# Patient Record
Sex: Female | Born: 1955 | Race: Black or African American | Hispanic: No | Marital: Single | Smoking: Current some day smoker
Health system: Southern US, Community
[De-identification: ages and names within clinical notes are randomized; demographics above are authoritative.]

## PROBLEM LIST (undated history)

## (undated) DIAGNOSIS — B353 Tinea pedis: Secondary | ICD-10-CM

## (undated) DIAGNOSIS — D0591 Unspecified type of carcinoma in situ of right breast: Secondary | ICD-10-CM

## (undated) DIAGNOSIS — F1011 Alcohol abuse, in remission: Secondary | ICD-10-CM

## (undated) DIAGNOSIS — F209 Schizophrenia, unspecified: Secondary | ICD-10-CM

## (undated) DIAGNOSIS — E559 Vitamin D deficiency, unspecified: Secondary | ICD-10-CM

## (undated) DIAGNOSIS — I10 Essential (primary) hypertension: Secondary | ICD-10-CM

## (undated) DIAGNOSIS — R413 Other amnesia: Secondary | ICD-10-CM

## (undated) DIAGNOSIS — L853 Xerosis cutis: Secondary | ICD-10-CM

## (undated) DIAGNOSIS — E785 Hyperlipidemia, unspecified: Secondary | ICD-10-CM

## (undated) DIAGNOSIS — C50919 Malignant neoplasm of unspecified site of unspecified female breast: Secondary | ICD-10-CM

## (undated) DIAGNOSIS — R32 Unspecified urinary incontinence: Secondary | ICD-10-CM

## (undated) DIAGNOSIS — F0280 Dementia in other diseases classified elsewhere without behavioral disturbance: Secondary | ICD-10-CM

## (undated) DIAGNOSIS — F0390 Unspecified dementia without behavioral disturbance: Secondary | ICD-10-CM

## (undated) DIAGNOSIS — M25551 Pain in right hip: Secondary | ICD-10-CM

## (undated) DIAGNOSIS — G4700 Insomnia, unspecified: Secondary | ICD-10-CM

## (undated) DIAGNOSIS — R079 Chest pain, unspecified: Secondary | ICD-10-CM

## (undated) DIAGNOSIS — E119 Type 2 diabetes mellitus without complications: Secondary | ICD-10-CM

## (undated) DIAGNOSIS — Z87440 Personal history of urinary (tract) infections: Secondary | ICD-10-CM

## (undated) HISTORY — DX: Insomnia, unspecified: G47.00

## (undated) HISTORY — DX: Xerosis cutis: L85.3

## (undated) HISTORY — DX: Tinea pedis: B35.3

## (undated) HISTORY — DX: Personal history of urinary (tract) infections: Z87.440

## (undated) HISTORY — DX: Hyperlipidemia, unspecified: E78.5

## (undated) HISTORY — DX: Dementia in other diseases classified elsewhere, unspecified severity, without behavioral disturbance, psychotic disturbance, mood disturbance, and anxiety: F02.80

## (undated) HISTORY — DX: Unspecified urinary incontinence: R32

## (undated) HISTORY — DX: Schizophrenia, unspecified: F20.9

## (undated) HISTORY — DX: Unspecified type of carcinoma in situ of right breast: D05.91

## (undated) HISTORY — PX: TUBAL LIGATION: SHX77

## (undated) HISTORY — DX: Chest pain, unspecified: R07.9

## (undated) HISTORY — DX: Pain in right hip: M25.551

## (undated) HISTORY — DX: Vitamin D deficiency, unspecified: E55.9

## (undated) HISTORY — DX: Malignant neoplasm of unspecified site of unspecified female breast: C50.919

## (undated) HISTORY — PX: BREAST LUMPECTOMY: SHX2

---

## 2016-08-21 ENCOUNTER — Emergency Department (HOSPITAL_COMMUNITY)
Admission: EM | Admit: 2016-08-21 | Discharge: 2016-08-22 | Disposition: A | Discharge status: Home / Self Care | Payer: Medicare Other | Attending: Emergency Medicine | Admitting: Emergency Medicine

## 2016-08-21 ENCOUNTER — Encounter (HOSPITAL_COMMUNITY): Payer: Self-pay | Admitting: Emergency Medicine

## 2016-08-21 DIAGNOSIS — R040 Epistaxis: Secondary | ICD-10-CM

## 2016-08-21 DIAGNOSIS — F17200 Nicotine dependence, unspecified, uncomplicated: Secondary | ICD-10-CM | POA: Insufficient documentation

## 2016-08-21 DIAGNOSIS — I10 Essential (primary) hypertension: Secondary | ICD-10-CM | POA: Diagnosis not present

## 2016-08-21 HISTORY — DX: Unspecified dementia, unspecified severity, without behavioral disturbance, psychotic disturbance, mood disturbance, and anxiety: F03.90

## 2016-08-21 HISTORY — DX: Essential (primary) hypertension: I10

## 2016-08-21 HISTORY — DX: Other amnesia: R41.3

## 2016-08-21 MED ORDER — OXYMETAZOLINE HCL 0.05 % NA SOLN
1.0000 | Freq: Once | NASAL | Status: AC
  Administered 2016-08-21: 1 via NASAL
  Filled 2016-08-21: qty 15

## 2016-08-21 NOTE — ED Notes (Signed)
Bed: WA17 Expected date:  Expected time:  Means of arrival:  Comments: HTN

## 2016-08-21 NOTE — ED Provider Notes (Signed)
[facility] DEPT Provider Note   CSN: [id number] Arrival date & time: 08/21/16  2208  By signing my name below, I, Higinio Plan, attest that this documentation has been prepared under the direction and in the presence of Saliah Crisp, MD . Electronically Signed: Higinio Plan, Scribe. 08/21/2016. 11:51 PM.  History   Chief Complaint Chief Complaint  Patient presents with  . Epistaxis   The history is provided by the patient. No language interpreter was used.  Epistaxis   This is a recurrent problem. The current episode started 3 to 5 hours ago. The problem occurs constantly. The problem has been resolved. The problem is associated with an unknown factor. She has tried vasoconstrictors for the symptoms. The treatment provided significant relief. Her past medical history does not include sinus problems.   HPI Comments: Lindsey Garcia is a 60 y.o. female who presents to the Emergency Department complaining of gradually improving, epistaxis that began at ~7:30 PM this evening. Pt reports she awoke from a nap this evening with an epistaxis. She states she has had similar episodes before but has never sustained one for this long. Pt is not actively bleeding at this time. She reports her house was not abnoramlly dry today. She denies use of blood thinners.   Past Medical History:  Diagnosis Date  . Amnestic disorder   . Dementia   . Hypertension    There are no active problems to display for this patient.  History reviewed. No pertinent surgical history.  OB History    No data available     Home Medications    Prior to Admission medications   Not on File    Family History No family history on file.  Social History Social History  Substance Use Topics  . Smoking status: Never Smoker  . Smokeless tobacco: Not on file  . Alcohol use Not on file     Allergies   Review of patient's allergies indicates not on file.   Review of Systems Review of Systems  Constitutional:  Negative for fever.  HENT: Positive for nosebleeds. Negative for congestion.   Respiratory: Negative for shortness of breath.   Cardiovascular: Negative for chest pain.  All other systems reviewed and are negative.  Physical Exam Updated Vital Signs BP 121/84   Pulse 80   Temp 98.1 F (36.7 C) (Oral)   Resp 18   Ht 5\' 5"  (1.651 m)   LMP  (LMP Unknown)   SpO2 96%   Physical Exam  Constitutional: She is oriented to person, place, and time. She appears well-developed and well-nourished. No distress.  HENT:  Head: Normocephalic and atraumatic.  Nose: Nose normal.  Mouth/Throat: Oropharynx is clear and moist. No oropharyngeal exudate.  No anterior bleed, no bleeding down the back of the throat Moist mucous membranes   Eyes: Conjunctivae and EOM are normal. Pupils are equal, round, and reactive to light.  Neck: Normal range of motion. Neck supple. No JVD present.  Trachea midline No bruit  Cardiovascular: Normal rate, regular rhythm, normal heart sounds and intact distal pulses.   Pulmonary/Chest: Effort normal and breath sounds normal. No stridor. No respiratory distress.  Abdominal: Soft. Bowel sounds are normal. She exhibits no distension. There is no rebound and no guarding.  Musculoskeletal: Normal range of motion.  Neurological: She is alert and oriented to person, place, and time. She has normal reflexes.  Skin: Skin is warm and dry. Capillary refill takes less than 2 seconds.  Psychiatric: She has a normal mood  and affect. Her behavior is normal.  Nursing note and vitals reviewed.  ED Treatments / Results  Labs (all labs ordered are listed, but only abnormal results are displayed) Labs Reviewed - No data to display  EKG  EKG Interpretation None       Radiology No results found.  Procedures Procedures  DIAGNOSTIC STUDIES:  Oxygen Saturation is 96% on RA, normal by my interpretation.    COORDINATION OF CARE:  11:51 PM Discussed treatment plan with pt at  bedside and pt agreed to plan.  Medications Ordered in ED Medications  oxymetazoline (AFRIN) 0.05 % nasal spray 1 spray (1 spray Each Nare Given 08/21/16 2232)   Initial Impression / Assessment and Plan / ED Course  I have reviewed the triage vital signs and the nursing notes.  Pertinent labs & imaging results that were available during my care of the patient were reviewed by me and considered in my medical decision making (see chart for details).  Clinical Course    Vitals:   08/21/16 2208 08/21/16 2211  BP: 135/65 121/84  Pulse: 78 80  Resp: 18   Temp: 98.1 F (36.7 C)     Medications  oxymetazoline (AFRIN) 0.05 % nasal spray 1 spray (1 spray Each Nare Given 08/21/16 2232)    I personally performed the services described in this documentation, which was scribed in my presence. The recorded information has been reviewed and is accurate.   Final Clinical Impressions(s) / ED Diagnoses   Final diagnoses:  None   No further bleeding in the ED.  All questions answered to patient's satisfaction. Based on history and exam patient has been appropriately medically screened and emergency conditions excluded. Patient is stable for discharge at this time. Follow up with your PMD for recheck in 2 days and strict return precautions given New Prescriptions New Prescriptions   No medications on file     Amarya Kuehl, MD 08/22/16 [phone]

## 2016-08-21 NOTE — ED Triage Notes (Signed)
Per EMS: Pt from [facility] home. Pt c/o epistaxis since 1830. Pt has tissue in nose to stop bleeding, but can't get it to stop. Pt has nosebleeds frequently. Pt not on any anticoagulants. Pt has dementia. Pt is oriented to person, place, but not age. Pt can walk with assistance. Pt c/o headache started while on the truck  153/117, 83, 16, cbg 132

## 2016-08-22 ENCOUNTER — Encounter (HOSPITAL_COMMUNITY): Payer: Self-pay | Admitting: Emergency Medicine

## 2016-08-22 NOTE — ED Notes (Signed)
[place] given to pt.

## 2016-08-23 ENCOUNTER — Encounter (HOSPITAL_COMMUNITY): Payer: Self-pay | Admitting: Emergency Medicine

## 2016-08-23 ENCOUNTER — Emergency Department (HOSPITAL_COMMUNITY)
Admission: EM | Admit: 2016-08-23 | Discharge: 2016-08-23 | Disposition: A | Discharge status: Home / Self Care | Payer: Medicare Other | Attending: Emergency Medicine | Admitting: Emergency Medicine

## 2016-08-23 DIAGNOSIS — R040 Epistaxis: Secondary | ICD-10-CM | POA: Diagnosis not present

## 2016-08-23 DIAGNOSIS — F17200 Nicotine dependence, unspecified, uncomplicated: Secondary | ICD-10-CM | POA: Insufficient documentation

## 2016-08-23 DIAGNOSIS — R51 Headache: Secondary | ICD-10-CM | POA: Diagnosis not present

## 2016-08-23 DIAGNOSIS — I10 Essential (primary) hypertension: Secondary | ICD-10-CM | POA: Insufficient documentation

## 2016-08-23 MED ORDER — SILVER NITRATE-POT NITRATE 75-25 % EX MISC
1.0000 "application " | Freq: Once | CUTANEOUS | Status: AC
  Administered 2016-08-23: 1 via TOPICAL
  Filled 2016-08-23: qty 1

## 2016-08-23 MED ORDER — OXYMETAZOLINE HCL 0.05 % NA SOLN
1.0000 | Freq: Once | NASAL | Status: AC
  Administered 2016-08-23: 1 via NASAL
  Filled 2016-08-23: qty 15

## 2016-08-23 NOTE — ED Triage Notes (Signed)
Report called to [place] at [facility]

## 2016-08-23 NOTE — ED Provider Notes (Signed)
[facility] DEPT Provider Note   CSN: [id number] Arrival date & time: 08/23/16  1524  By signing my name below, I, Shanna Cisco, attest that this documentation has been prepared under the direction and in the presence of [employer], PA-C. Electronically signed by: Shanna Cisco, ED Scribe. 08/23/16. 5:05 PM.   History   Chief Complaint Chief Complaint  Patient presents with  . Epistaxis    resolved   The history is provided by the patient. No language interpreter was used.   HPI Comments:  Lindsey Garcia is a 60 y.o. female with PMHx of hypertension and amnestic disorder brought in by ambulance, who presents to the Emergency Department complaining of multiple episodes of epistaxis, which started yesterday. She was seen in the ED last night for same symptoms and was given Afrin nasal spray. Pt was advised to follow up with PCP tomorrow, but returned because she experienced another episode at 0900 this morning. Nosebleed lasted approximately an hour and stopped prior to EMS arrival. Associated symptoms include mild nausea and headache. Used approximately a roll of toilet paper to control bleeding. No denials reported.  Past Medical History:  Diagnosis Date  . Amnestic disorder   . Dementia   . Hypertension     There are no active problems to display for this patient.   No past surgical history on file.  OB History    No data available       Home Medications    Prior to Admission medications   Not on File    Family History No family history on file.  Social History Social History  Substance Use Topics  . Smoking status: Never Smoker  . Smokeless tobacco: Not on file  . Alcohol use Not on file     Allergies   Review of patient's allergies indicates not on file.   Review of Systems Review of Systems  HENT: Rhinorrhea:  epistaxis.   Gastrointestinal: Positive for nausea.  Neurological: Positive for headaches.    Physical Exam Updated Vital Signs LMP   (LMP Unknown)   Physical Exam  Constitutional: She is oriented to person, place, and time. She appears well-developed and well-nourished.  HENT:  Head: Normocephalic and atraumatic.  Mouth/Throat: Oropharynx is clear and moist.  Area of bleeding in anterior nasal septum.  Eyes: Pupils are equal, round, and reactive to light.  Neck: Normal range of motion.  Cardiovascular: Normal rate, regular rhythm and normal heart sounds.   Pulmonary/Chest: Effort normal and breath sounds normal.  Musculoskeletal: Normal range of motion.  Neurological: She is alert and oriented to person, place, and time.  Skin: Skin is warm and dry.  Psychiatric: She has a normal mood and affect.  Nursing note and vitals reviewed.    ED Treatments / Results  DIAGNOSTIC STUDIES:  Oxygen Saturation is 96% on room air, normal by my interpretation.    COORDINATION OF CARE:  4:40 PM Discussed treatment plan with pt at bedside, which includes Afrin nasal spray and cauterization with silver nitrate, and pt agreed to plan.  Labs (all labs ordered are listed, but only abnormal results are displayed) Labs Reviewed - No data to display  EKG  EKG Interpretation None       Radiology No results found.  Procedures .Epistaxis Management Date/Time: 08/27/2016 3:57 PM Performed by: Dalia Heading Authorized by: Dalia Heading   Consent:    Consent obtained:  Verbal   Consent given by:  Patient   Risks discussed:  Bleeding   Alternatives  discussed:  No treatment and delayed treatment Anesthesia (see MAR for exact dosages):    Anesthesia method:  None Procedure details:    Treatment site:  R anterior   Treatment method:  Silver nitrate   Treatment episode: initial   Post-procedure details:    Assessment:  No improvement   Patient tolerance of procedure:  Tolerated well, no immediate complications Comments:     She was not actively bleeding on my examination, but I did cauterize an area that  appeared to be the source of bleeding   (including critical care time)  Medications Ordered in ED Medications - No data to display   Initial Impression / Assessment and Plan / ED Course  I have reviewed the triage vital signs and the nursing notes.  Pertinent labs & imaging results that were available during my care of the patient were reviewed by me and considered in my medical decision making (see chart for details).  Clinical Course    Room air  Final Clinical Impressions(s) / ED Diagnoses   Final diagnoses:  None    New Prescriptions New Prescriptions   No medications on file  I personally performed the services described in this documentation, which was scribed in my presence. The recorded information has been reviewed and is accurate.     Dalia Heading, PA-C 08/27/16 1558    Isla Pence, MD 08/27/16 2222

## 2016-08-23 NOTE — ED Triage Notes (Signed)
[facility]/EMS called to  Transport pt to [facility]

## 2016-08-23 NOTE — Discharge Instructions (Signed)
Follow up with primary care doctor.

## 2016-08-23 NOTE — ED Triage Notes (Signed)
Per EMS- facility called to transport pt. To ED for c/o epistaxis x 2 hours. No bleeding at present. Pt stated that nosebleed stopped prior to EMS arrival. Pt is alert, oriented, cooperative and ambulatory

## 2016-08-24 ENCOUNTER — Emergency Department (HOSPITAL_COMMUNITY)
Admission: EM | Admit: 2016-08-24 | Discharge: 2016-08-24 | Disposition: A | Discharge status: Home / Self Care | Payer: Medicare Other | Attending: Emergency Medicine | Admitting: Emergency Medicine

## 2016-08-24 ENCOUNTER — Encounter (HOSPITAL_COMMUNITY): Payer: Self-pay | Admitting: Emergency Medicine

## 2016-08-24 DIAGNOSIS — Z79899 Other long term (current) drug therapy: Secondary | ICD-10-CM | POA: Diagnosis not present

## 2016-08-24 DIAGNOSIS — F17200 Nicotine dependence, unspecified, uncomplicated: Secondary | ICD-10-CM | POA: Diagnosis not present

## 2016-08-24 DIAGNOSIS — I10 Essential (primary) hypertension: Secondary | ICD-10-CM | POA: Insufficient documentation

## 2016-08-24 DIAGNOSIS — Z7984 Long term (current) use of oral hypoglycemic drugs: Secondary | ICD-10-CM | POA: Diagnosis not present

## 2016-08-24 DIAGNOSIS — Z7982 Long term (current) use of aspirin: Secondary | ICD-10-CM | POA: Diagnosis not present

## 2016-08-24 DIAGNOSIS — R040 Epistaxis: Secondary | ICD-10-CM | POA: Diagnosis not present

## 2016-08-24 LAB — CBC WITH DIFFERENTIAL/PLATELET
Basophils Absolute: 0 10*3/uL (ref 0.0–0.1)
Basophils Relative: 0 %
EOS PCT: 2 %
Eosinophils Absolute: 0.2 10*3/uL (ref 0.0–0.7)
HCT: 31.6 % — ABNORMAL LOW (ref 36.0–46.0)
HEMOGLOBIN: 10.2 g/dL — AB (ref 12.0–15.0)
LYMPHS ABS: 2.5 10*3/uL (ref 0.7–4.0)
Lymphocytes Relative: 29 %
MCH: 28.8 pg (ref 26.0–34.0)
MCHC: 32.3 g/dL (ref 30.0–36.0)
MCV: 89.3 fL (ref 78.0–100.0)
MONOS PCT: 6 %
Monocytes Absolute: 0.5 10*3/uL (ref 0.1–1.0)
NEUTROS PCT: 63 %
Neutro Abs: 5.5 10*3/uL (ref 1.7–7.7)
Platelets: 327 10*3/uL (ref 150–400)
RBC: 3.54 MIL/uL — ABNORMAL LOW (ref 3.87–5.11)
RDW: 13.2 % (ref 11.5–15.5)
WBC: 8.7 10*3/uL (ref 4.0–10.5)

## 2016-08-24 LAB — BASIC METABOLIC PANEL
Anion gap: 7 (ref 5–15)
BUN: 13 mg/dL (ref 6–20)
CHLORIDE: 107 mmol/L (ref 101–111)
CO2: 24 mmol/L (ref 22–32)
Calcium: 9.3 mg/dL (ref 8.9–10.3)
Creatinine, Ser: 0.74 mg/dL (ref 0.44–1.00)
GFR calc Af Amer: >60 mL/min (ref >60)
GFR calc non Af Amer: >60 mL/min (ref >60)
Glucose, Bld: 140 mg/dL — ABNORMAL HIGH (ref 65–99)
Potassium: 3.5 mmol/L (ref 3.5–5.1)
Sodium: 138 mmol/L (ref 135–145)

## 2016-08-24 LAB — PROTIME-INR
INR: 1.07
PROTHROMBIN TIME: 13.9 s (ref 11.4–15.2)

## 2016-08-24 MED ORDER — SALINE SPRAY 0.65 % NA SOLN: 1.0000 | Freq: Two times a day (BID) | NASAL | 0 refills | Status: DC

## 2016-08-24 MED ORDER — OXYMETAZOLINE HCL 0.05 % NA SOLN: 1.0000 | Freq: Two times a day (BID) | NASAL | 0 refills | Status: DC

## 2016-08-24 NOTE — ED Triage Notes (Signed)
Per EMS pt.from [facility] complaint of nose bleeding which noted  at 0430 this morning while  watching TV. Pt. Denied pain nor SOB. No active bleeding noted upon arrival to ED. Alert and oriented x2.

## 2016-08-24 NOTE — ED Notes (Signed)
Bed: [id number] Expected date:  Expected time:  Means of arrival:  Comments: EMS 60yo F from [facility] / Nosebleed

## 2016-08-24 NOTE — ED Provider Notes (Signed)
[facility] DEPT Provider Note   CSN: [id number] Arrival date & time: 08/24/16  [id number]     History   Chief Complaint Chief Complaint  Patient presents with  . Epistaxis    HPI Lindsey Garcia is a 60 y.o. female.  The history is provided by the patient and the nursing home. No language interpreter was used.   Lindsey Garcia is a 60 y.o. female who presents to the Emergency Department complaining of epistaxis.  Level V caveat due to dementia. Hx is provided by [facility] nurse and hospital records.  Per nursing home pt had a nose bleed, additional details are unavailable.  Pt reports bleeding from her right nare, no additional complaints.  Per ED records pt has had two ED visits in last three days for epistaxis.  Pt denies fever, headache, sob, N/V/D, chest pain.    Past Medical History:  Diagnosis Date  . Amnestic disorder   . Dementia   . Hypertension     There are no active problems to display for this patient.   History reviewed. No pertinent surgical history.  OB History    No data available       Home Medications    Prior to Admission medications   Medication Sig Start Date End Date Taking? Authorizing Provider  benztropine (COGENTIN) 1 MG tablet Take 1 mg by mouth 2 (two) times daily.   Yes Historical Provider, MD  busPIRone (BUSPAR) 15 MG tablet Take 15 mg by mouth 2 (two) times daily.   Yes Historical Provider, MD  cholecalciferol (VITAMIN D) 1000 units tablet Take 1,000 Units by mouth daily.   Yes Historical Provider, MD  donepezil (ARICEPT) 10 MG tablet Take 10 mg by mouth at bedtime.   Yes Historical Provider, MD  lithium carbonate 300 MG capsule Take 600 mg by mouth at bedtime.   Yes Historical Provider, MD  Melatonin 3 MG TABS Take 1 tablet by mouth daily.   Yes Historical Provider, MD  metFORMIN (GLUCOPHAGE) 500 MG tablet Take 500 mg by mouth 2 (two) times daily with a meal.   Yes Historical Provider, MD  metoprolol succinate (TOPROL-XL) 25 MG 24 hr tablet  Take 25 mg by mouth 2 (two) times daily.   Yes Historical Provider, MD  Multiple Vitamin (MULTIVITAMIN WITH MINERALS) TABS tablet Take 1 tablet by mouth daily.   Yes Historical Provider, MD  pravastatin (PRAVACHOL) 20 MG tablet Take 20 mg by mouth daily.   Yes Historical Provider, MD  risperiDONE (RISPERDAL) 3 MG tablet Take 3 mg by mouth at bedtime.   Yes Historical Provider, MD  aspirin 81 MG chewable tablet Chew 81 mg by mouth daily.    Historical Provider, MD  oxymetazoline (AFRIN NASAL SPRAY) 0.05 % nasal spray Place 1 spray into both nostrils 2 (two) times daily. Use for three days 08/24/16   Quintella Reichert, MD  sodium chloride (OCEAN) 0.65 % SOLN nasal spray Place 1 spray into both nostrils 2 (two) times daily. 08/24/16   Quintella Reichert, MD    Family History No family history on file.  Social History Social History  Substance Use Topics  . Smoking status: Current Some Day Smoker  . Smokeless tobacco: Never Used  . Alcohol use No     Allergies   Review of patient's allergies indicates no known allergies.   Review of Systems Review of Systems  All other systems reviewed and are negative.    Physical Exam Updated Vital Signs BP 126/76 (BP Location: Left Arm)  Pulse 72   Temp 98.7 F (37.1 C) (Oral)   Resp 18   LMP  (LMP Unknown)   SpO2 98%   Physical Exam  Constitutional: She appears well-developed and well-nourished. No distress.  HENT:  Head: Normocephalic and atraumatic.  Mouth/Throat: Oropharynx is clear and moist.  Dried blood in right anterior nare  Cardiovascular: Normal rate and regular rhythm.   No murmur heard. Pulmonary/Chest: Effort normal and breath sounds normal. No respiratory distress.  Abdominal: Soft. She exhibits no mass. There is no tenderness. There is no guarding.  Musculoskeletal: She exhibits no edema or tenderness.  Neurological: She is alert.  Disoriented to place and time.  Pleasantly confused  Skin: Skin is warm and dry.    Psychiatric: She has a normal mood and affect. Her behavior is normal.  Nursing note and vitals reviewed.    ED Treatments / Results  Labs (all labs ordered are listed, but only abnormal results are displayed) Labs Reviewed  BASIC METABOLIC PANEL - Abnormal; Notable for the following:       Result Value   Glucose, Bld 140 (*)    All other components within normal limits  CBC WITH DIFFERENTIAL/PLATELET - Abnormal; Notable for the following:    RBC 3.54 (*)    Hemoglobin 10.2 (*)    HCT 31.6 (*)    All other components within normal limits  PROTIME-INR    EKG  EKG Interpretation None       Radiology No results found.  Procedures Procedures (including critical care time)  Medications Ordered in ED Medications - No data to display   Initial Impression / Assessment and Plan / ED Course  I have reviewed the triage vital signs and the nursing notes.  Pertinent labs & imaging results that were available during my care of the patient were reviewed by me and considered in my medical decision making (see chart for details).  Clinical Course    Patient with history of dementia in the emergency department for epistaxis that has resolved. She does have dried blood in her anterior nare. Labs obtained given she is a poor historian and this is her third ED visit for epistaxis. CBC demonstrates mild anemia, BMP with normal renal function.  Plan to DC home with nasal saline, Afrin, outpatient PCP and ENT follow-up as well as return precautions.  Final Clinical Impressions(s) / ED Diagnoses   Final diagnoses:  Acute anterior epistaxis    New Prescriptions Discharge Medication List as of 08/24/2016  8:35 AM    START taking these medications   Details  oxymetazoline (AFRIN NASAL SPRAY) 0.05 % nasal spray Place 1 spray into both nostrils 2 (two) times daily. Use for three days, Starting Fri 08/24/2016, Print    sodium chloride (OCEAN) 0.65 % SOLN nasal spray Place 1 spray into  both nostrils 2 (two) times daily., Starting Fri 08/24/2016, Print         Quintella Reichert, MD 08/24/16 [phone]

## 2016-08-31 ENCOUNTER — Emergency Department (HOSPITAL_COMMUNITY)
Admission: EM | Admit: 2016-08-31 | Discharge: 2016-08-31 | Disposition: A | Discharge status: Home / Self Care | Payer: Medicare Other | Attending: Emergency Medicine | Admitting: Emergency Medicine

## 2016-08-31 ENCOUNTER — Encounter (HOSPITAL_COMMUNITY): Payer: Self-pay | Admitting: Emergency Medicine

## 2016-08-31 DIAGNOSIS — Z79899 Other long term (current) drug therapy: Secondary | ICD-10-CM | POA: Diagnosis not present

## 2016-08-31 DIAGNOSIS — F17200 Nicotine dependence, unspecified, uncomplicated: Secondary | ICD-10-CM | POA: Insufficient documentation

## 2016-08-31 DIAGNOSIS — Z7982 Long term (current) use of aspirin: Secondary | ICD-10-CM | POA: Diagnosis not present

## 2016-08-31 DIAGNOSIS — I10 Essential (primary) hypertension: Secondary | ICD-10-CM | POA: Diagnosis not present

## 2016-08-31 DIAGNOSIS — Z7984 Long term (current) use of oral hypoglycemic drugs: Secondary | ICD-10-CM | POA: Diagnosis not present

## 2016-08-31 DIAGNOSIS — R040 Epistaxis: Secondary | ICD-10-CM

## 2016-08-31 MED ORDER — SILVER NITRATE-POT NITRATE 75-25 % EX MISC
1.0000 | Freq: Once | CUTANEOUS | Status: AC
  Administered 2016-08-31: 1 via TOPICAL
  Filled 2016-08-31: qty 1

## 2016-08-31 NOTE — ED Notes (Signed)
[facility] notified of transportation need 

## 2016-08-31 NOTE — ED Provider Notes (Signed)
[facility] DEPT Provider Note   CSN: [id number] Arrival date & time: 08/31/16  1115     History   Chief Complaint Chief Complaint  Patient presents with  . Epistaxis    HPI Lindsey Garcia is a 60 y.o. female.  The history is provided by the patient.  Epistaxis   This is a recurrent problem. The current episode started 6 to 12 hours ago. The problem occurs every several days. The problem has been resolved. The problem is associated with nose-picking. She has tried applying pressure for the symptoms. Her past medical history is significant for frequent nosebleeds (4th ED visit in last 1.5 weeks). Her past medical history does not include bleeding disorder.   Lindsey Garcia is a 60 y.o. female with PMH significant for amnestic disorder, dementia, and HTN who presents with resolved epistaxis.  This is the patient's 4th visit in the last 1.5 weeks for the same.  She states she used the Afrin with relief.  She states this morning around 4 AM she got up and picked her nose and the bleeding started.  It will stop, but then when she scratches her nose it starts again.  It resolves with pressure.  No active bleeding presently.  Denies syncope, lightheadedness, or dizziness.    Past Medical History:  Diagnosis Date  . Amnestic disorder   . Dementia   . Hypertension     There are no active problems to display for this patient.   History reviewed. No pertinent surgical history.  OB History    No data available       Home Medications    Prior to Admission medications   Medication Sig Start Date End Date Taking? Authorizing Provider  aspirin 81 MG chewable tablet Chew 81 mg by mouth daily.   Yes Historical Provider, MD  benztropine (COGENTIN) 1 MG tablet Take 1 mg by mouth 2 (two) times daily.    Historical Provider, MD  busPIRone (BUSPAR) 15 MG tablet Take 15 mg by mouth 2 (two) times daily.    Historical Provider, MD  cholecalciferol (VITAMIN D) 1000 units tablet Take 1,000  Units by mouth daily.    Historical Provider, MD  donepezil (ARICEPT) 10 MG tablet Take 10 mg by mouth at bedtime.    Historical Provider, MD  lithium carbonate 300 MG capsule Take 600 mg by mouth at bedtime.    Historical Provider, MD  Melatonin 3 MG TABS Take 1 tablet by mouth daily.    Historical Provider, MD  metFORMIN (GLUCOPHAGE) 500 MG tablet Take 500 mg by mouth 2 (two) times daily with a meal.    Historical Provider, MD  metoprolol succinate (TOPROL-XL) 25 MG 24 hr tablet Take 25 mg by mouth 2 (two) times daily.    Historical Provider, MD  Multiple Vitamin (MULTIVITAMIN WITH MINERALS) TABS tablet Take 1 tablet by mouth daily.    Historical Provider, MD  oxymetazoline (AFRIN NASAL SPRAY) 0.05 % nasal spray Place 1 spray into both nostrils 2 (two) times daily. Use for three days 08/24/16   Quintella Reichert, MD  pravastatin (PRAVACHOL) 20 MG tablet Take 20 mg by mouth daily.    Historical Provider, MD  risperiDONE (RISPERDAL) 3 MG tablet Take 3 mg by mouth at bedtime.    Historical Provider, MD  sodium chloride (OCEAN) 0.65 % SOLN nasal spray Place 1 spray into both nostrils 2 (two) times daily. 08/24/16   Quintella Reichert, MD    Family History No family history on file.  Social  History Social History  Substance Use Topics  . Smoking status: Current Some Day Smoker  . Smokeless tobacco: Never Used  . Alcohol use No     Allergies   Review of patient's allergies indicates no known allergies.   Review of Systems Review of Systems  HENT: Positive for nosebleeds.   Neurological: Negative for dizziness, syncope and light-headedness.  All other systems reviewed and are negative.    Physical Exam Updated Vital Signs BP 149/83 (BP Location: Left Wrist)   Pulse 95   Temp 98.7 F (37.1 C) (Oral)   Resp 20   Ht 5\' 5"  (1.651 m)   Wt 104.3 kg   LMP  (LMP Unknown)   SpO2 96%   BMI 38.27 kg/m   Physical Exam  Constitutional: She is oriented to person, place, and time. She appears  well-developed and well-nourished.  HENT:  Head: Normocephalic and atraumatic.  Right Ear: External ear normal.  Left Ear: External ear normal.  Nose: No nasal deformity, septal deviation or nasal septal hematoma. Epistaxis is observed.  Mouth/Throat: Oropharynx is clear and moist.  No blood in posterior oropharynx. Source visualized in the right anterior lateral nare.  Dried blood in right anterior nare.   Eyes: Conjunctivae are normal. No scleral icterus.  Neck: No tracheal deviation present.  Cardiovascular: Normal rate and regular rhythm.   Pulmonary/Chest: Effort normal and breath sounds normal. No respiratory distress.  Abdominal: She exhibits no distension.  Musculoskeletal: Normal range of motion.  Neurological: She is alert and oriented to person, place, and time.  Skin: Skin is warm and dry.  Psychiatric: She has a normal mood and affect. Her behavior is normal.     ED Treatments / Results  Labs (all labs ordered are listed, but only abnormal results are displayed) Labs Reviewed - No data to display  EKG  EKG Interpretation None       Radiology No results found.  Procedures .Epistaxis Management Date/Time: 08/31/2016 12:00 PM Performed by: Gloriann Loan Authorized by: Gloriann Loan   Consent:    Consent obtained:  Verbal   Consent given by:  Patient   Risks discussed:  Nasal injury   Alternatives discussed:  No treatment Procedure details:    Treatment site:  R anterior   Treatment method:  Silver nitrate   Treatment complexity:  Limited   Treatment episode: initial   Post-procedure details:    Patient tolerance of procedure:  Tolerated well, no immediate complications Comments:     Applied to source to prevent further bleeding.     (including critical care time)    Medications Ordered in ED Medications  silver nitrate applicators applicator 1 Stick (1 Stick Topical Given by Other 08/31/16 1220)     Initial Impression / Assessment and Plan / ED  Course  I have reviewed the triage vital signs and the nursing notes.  Pertinent labs & imaging results that were available during my care of the patient were reviewed by me and considered in my medical decision making (see chart for details).  Clinical Course   Patient presents with epistaxis that has resolved.  Likely due to trauma, nose picking.  No epistaxis in ED.  VSS, NAD.  Source visualized and this was cauterized with silver nitrate to prevent further episodes.  Discussed abstaining from nose picking or scratching to prevent further episodes.  Follow up PCP.  Return precautions discussed.  Stable for discharge.   Case has been discussed with Dr. Tomi Bamberger who agrees with the  above plan for discharge.   Final Clinical Impressions(s) / ED Diagnoses   Final diagnoses:  Right-sided epistaxis  Anterior epistaxis    New Prescriptions New Prescriptions   No medications on file     Gloriann Loan, PA-C 08/31/16 1253    Dorie Rank, MD 09/02/16 [phone]

## 2016-08-31 NOTE — ED Triage Notes (Signed)
Per EMS, patient's nose has been bleeding for an hour. Denies blood thinner use. Upon EMS, arrival patient's nose was not bleeding. Patient is from [facility].

## 2016-08-31 NOTE — ED Notes (Signed)
Bed: WA02 Expected date:  Expected time:  Means of arrival:  Comments: 

## 2016-09-01 ENCOUNTER — Emergency Department (HOSPITAL_COMMUNITY)
Admission: EM | Admit: 2016-09-01 | Discharge: 2016-09-02 | Disposition: A | Discharge status: Home / Self Care | Payer: Medicare Other | Attending: Emergency Medicine | Admitting: Emergency Medicine

## 2016-09-01 ENCOUNTER — Encounter (HOSPITAL_COMMUNITY): Payer: Self-pay | Admitting: Emergency Medicine

## 2016-09-01 DIAGNOSIS — Z79899 Other long term (current) drug therapy: Secondary | ICD-10-CM | POA: Insufficient documentation

## 2016-09-01 DIAGNOSIS — F17200 Nicotine dependence, unspecified, uncomplicated: Secondary | ICD-10-CM | POA: Diagnosis not present

## 2016-09-01 DIAGNOSIS — Z7982 Long term (current) use of aspirin: Secondary | ICD-10-CM | POA: Diagnosis not present

## 2016-09-01 DIAGNOSIS — I10 Essential (primary) hypertension: Secondary | ICD-10-CM | POA: Insufficient documentation

## 2016-09-01 DIAGNOSIS — Z7984 Long term (current) use of oral hypoglycemic drugs: Secondary | ICD-10-CM | POA: Insufficient documentation

## 2016-09-01 DIAGNOSIS — R040 Epistaxis: Secondary | ICD-10-CM | POA: Diagnosis present

## 2016-09-01 MED ORDER — BACITRACIN ZINC 500 UNIT/GM EX OINT
TOPICAL_OINTMENT | Freq: Two times a day (BID) | CUTANEOUS | Status: DC
  Administered 2016-09-01: 1 via TOPICAL
  Filled 2016-09-01: qty 0.9

## 2016-09-01 NOTE — ED Notes (Signed)
Upon arrival to patient's room. Patient's right nostril was bleeding. PA made aware and to bedside.

## 2016-09-01 NOTE — ED Provider Notes (Signed)
[facility] DEPT Provider Note   CSN: [id number] Arrival date & time: 09/01/16  1451     History   Chief Complaint Chief Complaint  Patient presents with  . Epistaxis    HPI Lindsey Garcia is a 60 y.o. female.  Patient with no past history of blood thinner use, dementia, presents with complaint of right anterior nosebleed. Patient has been seen in emergency department multiple times in the past 2 weeks for the same thing. Patient states that she rubbed her nose, just prior to arrival, and it began to bleed again. She was seen in emergency department yesterday and had attempted cauterization with silver nitrate after the bleeding stopped. Patient states that she pinches her nose when the bleeding starts. Sometimes the blood runs down the back of her throat and makes her feel very uncomfortable. Bleeding has currently stopped. No other treatments prior to arrival. Patient states that Afrin makes her nosebleed more, so she has not been using this. She has used Vaseline without relief. Patient denies any lightheadedness or dizziness. No syncope. No shortness of breath or chest pain. Onset of symptoms acute. Course is resolved. Nothing makes symptoms better or worse.      Past Medical History:  Diagnosis Date  . Amnestic disorder   . Dementia   . Hypertension     There are no active problems to display for this patient.   History reviewed. No pertinent surgical history.  OB History    No data available       Home Medications    Prior to Admission medications   Medication Sig Start Date End Date Taking? Authorizing Provider  aspirin 81 MG chewable tablet Chew 81 mg by mouth daily.    Historical Provider, MD  benztropine (COGENTIN) 1 MG tablet Take 1 mg by mouth 2 (two) times daily.    Historical Provider, MD  busPIRone (BUSPAR) 15 MG tablet Take 15 mg by mouth 2 (two) times daily.    Historical Provider, MD  cholecalciferol (VITAMIN D) 1000 units tablet Take 1,000 Units  by mouth daily.    Historical Provider, MD  donepezil (ARICEPT) 10 MG tablet Take 10 mg by mouth at bedtime.    Historical Provider, MD  lithium carbonate 300 MG capsule Take 600 mg by mouth at bedtime.    Historical Provider, MD  Melatonin 3 MG TABS Take 1 tablet by mouth daily.    Historical Provider, MD  metFORMIN (GLUCOPHAGE) 500 MG tablet Take 500 mg by mouth 2 (two) times daily with a meal.    Historical Provider, MD  metoprolol succinate (TOPROL-XL) 25 MG 24 hr tablet Take 25 mg by mouth 2 (two) times daily.    Historical Provider, MD  Multiple Vitamin (MULTIVITAMIN WITH MINERALS) TABS tablet Take 1 tablet by mouth daily.    Historical Provider, MD  oxymetazoline (AFRIN NASAL SPRAY) 0.05 % nasal spray Place 1 spray into both nostrils 2 (two) times daily. Use for three days 08/24/16   Quintella Reichert, MD  pravastatin (PRAVACHOL) 20 MG tablet Take 20 mg by mouth daily.    Historical Provider, MD  risperiDONE (RISPERDAL) 3 MG tablet Take 3 mg by mouth at bedtime.    Historical Provider, MD  sodium chloride (OCEAN) 0.65 % SOLN nasal spray Place 1 spray into both nostrils 2 (two) times daily. 08/24/16   Quintella Reichert, MD    Family History No family history on file.  Social History Social History  Substance Use Topics  . Smoking status: Current Some  Day Smoker  . Smokeless tobacco: Never Used  . Alcohol use No     Allergies   Review of patient's allergies indicates no known allergies.   Review of Systems Review of Systems  Constitutional: Negative for fever.  HENT: Positive for nosebleeds. Negative for rhinorrhea and sore throat.   Eyes: Negative for redness.  Respiratory: Negative for cough and shortness of breath.   Cardiovascular: Negative for chest pain.  Gastrointestinal: Negative for abdominal pain, diarrhea, nausea and vomiting.  Genitourinary: Negative for dysuria.  Musculoskeletal: Negative for myalgias.  Skin: Negative for rash.  Neurological: Negative for syncope and  light-headedness.     Physical Exam Updated Vital Signs BP 128/84 (BP Location: Right Arm)   Pulse 104   Temp 99.5 F (37.5 C) (Oral)   Resp 18   Ht 5\' 5"  (1.651 m)   Wt 104.3 kg   LMP  (LMP Unknown)   SpO2 97%   BMI 38.27 kg/m   Physical Exam  Constitutional: She appears well-developed and well-nourished.  HENT:  Head: Normocephalic and atraumatic.  Nose: Mucosal edema present. No sinus tenderness, nasal deformity or nasal septal hematoma. Epistaxis (Clot noted to the right anterior septum) is observed.  No foreign bodies.  Mouth/Throat: Oropharynx is clear and moist.  Eyes: Conjunctivae are normal.  Neck: Normal range of motion. Neck supple.  Pulmonary/Chest: No respiratory distress.  Neurological: She is alert.  Skin: Skin is warm and dry.  Psychiatric: She has a normal mood and affect.  Nursing note and vitals reviewed.    ED Treatments / Results   Procedures .Epistaxis Management Date/Time: 09/01/2016 9:52 PM Performed by: Carlisle Cater Authorized by: Carlisle Cater   Consent:    Consent obtained:  Verbal   Consent given by:  Patient   Risks discussed:  Pain   Alternatives discussed:  No treatment and referral Procedure details:    Treatment site:  R anterior   Treatment method:  Nasal balloon Post-procedure details:    Assessment:  Bleeding stopped   Patient tolerance of procedure:  Tolerated well, no immediate complications   (including critical care time)   Initial Impression / Assessment and Plan / ED Course  I have reviewed the triage vital signs and the nursing notes.  Pertinent labs & imaging results that were available during my care of the patient were reviewed by me and considered in my medical decision making (see chart for details).  Clinical Course   Patient seen and examined. Bleeding is currently stopped. She has a new clot noted to the right anterior septum. Will monitor to ensure that bleeding does not resume. At this time, would  not perform any other treatments including packing. We discussed application of Vaseline twice a day to help prevent fragility and bleeding.  Vital signs reviewed and are as follows: BP 128/84 (BP Location: Right Arm)   Pulse 104   Temp 99.5 F (37.5 C) (Oral)   Resp 18   Ht 5\' 5"  (1.651 m)   Wt 104.3 kg   LMP  (LMP Unknown)   SpO2 97%   BMI 38.27 kg/m   4:45 PM bleeding remained controlled. Will discharge to home at this time. Discussed instructions as above.  4:49 PM Bleeding resumed. Patient coached to hold pressure.   5:32 PM Bleeding again controlled with pressure. NT assisted in holding pressure.   9:51 PM Patient waiting several hours for transportation. Nose bleed began again. 4.5cm rhino rocket placed with good results. ENT follow-up given.  Final Clinical Impressions(s) / ED Diagnoses   Final diagnoses:  Right-sided epistaxis   Epistaxis. BP okay. No h/o blood thinners or coagulopathy.     New Prescriptions Current Discharge Medication List       Carlisle Cater, Hershal Coria 09/01/16 2153    Daleen Bo, MD 09/01/16 [phone]

## 2016-09-01 NOTE — ED Triage Notes (Signed)
Per EMS, patient has a right-sided nose bleed x20 minutes. Staff reported nose was bleeding on both sides and was seen at the ED yesterday. Patient is from [facility].

## 2016-09-01 NOTE — Discharge Instructions (Signed)
Please read and follow all provided instructions.  Your diagnoses today include:  1. Right-sided epistaxis     Tests performed today include:  Vital signs. See below for your results today.   Medications prescribed:   None  Home care instructions:  Read the educational materials provided and follow any instructions contained in this packet.  If your nosebleed happens again: Pinch your nose and hold for 15 minutes without letting go.  If this does not stop the bleeding, pinch and hold for another 15 minutes.  If it continues to bleed after this, please come to the Emergency Department or see your family doctor.   Apply Vaseline carefully to inside of nose with Q-tip twice a day for the next week.   Follow-up instructions: Please follow-up with your primary care provider in the next 3 days for further evaluation of your symptoms.   Return instructions:   Please return to the Emergency Department if you experience worsening symptoms.   Please return if you have any other emergent concerns.  Additional Information:  Your vital signs today were: BP 131/73    Pulse 93    Temp 99.5 F (37.5 C) (Oral)    Resp 16    Ht 5\' 5"  (1.651 m)    Wt 104.3 kg    LMP  (LMP Unknown)    SpO2 97%    BMI 38.27 kg/m  If your blood pressure (BP) was elevated above 135/85 this visit, please have this repeated by your doctor within one month. --------------

## 2016-09-01 NOTE — ED Notes (Signed)
NT at bedside holding pressure on patient's nose per PA.

## 2016-09-01 NOTE — ED Notes (Signed)
Bed: WA13 Expected date:  Expected time:  Means of arrival:  Comments: Hold for Res B 

## 2016-09-04 ENCOUNTER — Encounter (HOSPITAL_COMMUNITY): Payer: Self-pay | Admitting: Emergency Medicine

## 2016-09-04 ENCOUNTER — Emergency Department (HOSPITAL_COMMUNITY)
Admission: EM | Admit: 2016-09-04 | Discharge: 2016-09-04 | Disposition: A | Discharge status: Home / Self Care | Payer: Medicare Other | Attending: Emergency Medicine | Admitting: Emergency Medicine

## 2016-09-04 DIAGNOSIS — Z7982 Long term (current) use of aspirin: Secondary | ICD-10-CM | POA: Insufficient documentation

## 2016-09-04 DIAGNOSIS — R040 Epistaxis: Secondary | ICD-10-CM | POA: Insufficient documentation

## 2016-09-04 DIAGNOSIS — I10 Essential (primary) hypertension: Secondary | ICD-10-CM | POA: Insufficient documentation

## 2016-09-04 DIAGNOSIS — Z79899 Other long term (current) drug therapy: Secondary | ICD-10-CM | POA: Diagnosis not present

## 2016-09-04 DIAGNOSIS — F17210 Nicotine dependence, cigarettes, uncomplicated: Secondary | ICD-10-CM | POA: Insufficient documentation

## 2016-09-04 LAB — CBC
HCT: 30.2 % — ABNORMAL LOW (ref 36.0–46.0)
HEMOGLOBIN: 9.4 g/dL — AB (ref 12.0–15.0)
MCH: 28.4 pg (ref 26.0–34.0)
MCHC: 31.1 g/dL (ref 30.0–36.0)
MCV: 91.2 fL (ref 78.0–100.0)
PLATELETS: 456 10*3/uL — AB (ref 150–400)
RBC: 3.31 MIL/uL — AB (ref 3.87–5.11)
RDW: 13.5 % (ref 11.5–15.5)
WBC: 11.4 10*3/uL — AB (ref 4.0–10.5)

## 2016-09-04 MED ORDER — SILVER NITRATE-POT NITRATE 75-25 % EX MISC
1.0000 | Freq: Once | CUTANEOUS | Status: AC
  Administered 2016-09-04: 1 via TOPICAL
  Filled 2016-09-04: qty 1

## 2016-09-04 NOTE — Discharge Instructions (Signed)
It is very important that you see the ear nose throat doctor provided to help prevent recurrent bleeding. If your bleeding starts again and you're unable to get it stopped with pressure, return to the ER.

## 2016-09-04 NOTE — ED Notes (Signed)
Bed: [id number] Expected date:  Expected time:  Means of arrival:  Comments: 15F/epistaxis

## 2016-09-04 NOTE — ED Notes (Signed)
Bed: WHALB Expected date:  Expected time:  Means of arrival:  Comments: No bed 

## 2016-09-04 NOTE — ED Provider Notes (Signed)
[facility] DEPT Provider Note   CSN: [id number] Arrival date & time: 09/04/16  1226     History   Chief Complaint Chief Complaint  Patient presents with  . Epistaxis    HPI Lindsey Garcia is a 60 y.o. female presenting from her facility with right nosebleed. Patient has had multiple episodes of bleeding over last 2 weeks. Was here on 9/9 and had nasal packing placed. She states that this morning she was rubbing the underside of her nose to clear mucus and then started having bleeding out of the right nare. Is currently controlled. She denies being on any blood thinners or picking her nose. No pain or trauma. No dizziness.   HPI  Past Medical History:  Diagnosis Date  . Amnestic disorder   . Dementia   . Hypertension     There are no active problems to display for this patient.   History reviewed. No pertinent surgical history.  OB History    No data available       Home Medications    Prior to Admission medications   Medication Sig Start Date End Date Taking? Authorizing Provider  acetaminophen (TYLENOL) 500 MG tablet Take 500 mg by mouth every 6 (six) hours as needed for moderate pain.    Historical Provider, MD  alum & mag hydroxide-simeth ([place]) 200-200-20 MG/5ML suspension Take 30 mLs by mouth every 6 (six) hours as needed for indigestion or heartburn.    Historical Provider, MD  aspirin 81 MG chewable tablet Chew 81 mg by mouth daily.    Historical Provider, MD  benztropine (COGENTIN) 1 MG tablet Take 1 mg by mouth 2 (two) times daily.    Historical Provider, MD  bisacodyl (DULCOLAX) 5 MG EC tablet Take 10 mg by mouth daily as needed for moderate constipation.    Historical Provider, MD  busPIRone (BUSPAR) 15 MG tablet Take 15 mg by mouth 2 (two) times daily.    Historical Provider, MD  cholecalciferol (VITAMIN D) 1000 units tablet Take 1,000 Units by mouth daily.    Historical Provider, MD  donepezil (ARICEPT) 10 MG tablet Take 10 mg by mouth at bedtime.     Historical Provider, MD  guaifenesin (ROBITUSSIN) 100 MG/5ML syrup Take 200 mg by mouth 4 (four) times daily as needed for cough.    Historical Provider, MD  lithium carbonate 300 MG capsule Take 600 mg by mouth at bedtime.    Historical Provider, MD  loperamide (IMODIUM) 2 MG capsule Take 2 mg by mouth daily as needed for diarrhea or loose stools.    Historical Provider, MD  magnesium hydroxide (MILK OF MAGNESIA) 400 MG/5ML suspension Take 30 mLs by mouth daily as needed for mild constipation.    Historical Provider, MD  Melatonin 3 MG TABS Take 1 tablet by mouth daily.    Historical Provider, MD  metFORMIN (GLUCOPHAGE) 500 MG tablet Take 500 mg by mouth 2 (two) times daily with a meal.    Historical Provider, MD  metoprolol succinate (TOPROL-XL) 25 MG 24 hr tablet Take 25 mg by mouth 2 (two) times daily.    Historical Provider, MD  Multiple Vitamin (MULTIVITAMIN WITH MINERALS) TABS tablet Take 1 tablet by mouth daily.    Historical Provider, MD  pravastatin (PRAVACHOL) 20 MG tablet Take 20 mg by mouth daily.    Historical Provider, MD  risperiDONE (RISPERDAL) 3 MG tablet Take 3 mg by mouth at bedtime.    Historical Provider, MD  sodium chloride (OCEAN) 0.65 % SOLN nasal  spray Place 1 spray into both nostrils 2 (two) times daily. 08/24/16   Quintella Reichert, MD  traZODone (DESYREL) 50 MG tablet Take 50 mg by mouth at bedtime as needed for sleep.    Historical Provider, MD    Family History No family history on file.  Social History Social History  Substance Use Topics  . Smoking status: Current Some Day Smoker  . Smokeless tobacco: Never Used  . Alcohol use No     Allergies   Review of patient's allergies indicates no known allergies.   Review of Systems Review of Systems  HENT: Positive for nosebleeds.   Respiratory: Negative for shortness of breath.   Cardiovascular: Negative for chest pain.  Neurological: Positive for dizziness.  All other systems reviewed and are  negative.    Physical Exam Updated Vital Signs BP 113/66 (BP Location: Right Arm)   Pulse 82   Temp 98.4 F (36.9 C) (Oral)   Resp 17   LMP  (LMP Unknown)   SpO2 99%   Physical Exam  Constitutional: She appears well-developed and well-nourished.  HENT:  Head: Normocephalic and atraumatic.  Right Ear: External ear normal.  Left Ear: External ear normal.  Nose: Nose normal.  No current bleeding but there is bright red friable tissue to Keisselbach's plexus on right nare  Eyes: Right eye exhibits no discharge. Left eye exhibits no discharge.  Cardiovascular: Normal rate, regular rhythm and normal heart sounds.   Pulmonary/Chest: Effort normal.  Abdominal: She exhibits no distension.  Neurological: She is alert.  Skin: Skin is warm and dry.  Nursing note and vitals reviewed.    ED Treatments / Results  Labs (all labs ordered are listed, but only abnormal results are displayed) Labs Reviewed  CBC - Abnormal; Notable for the following:       Result Value   WBC 11.4 (*)    RBC 3.31 (*)    Hemoglobin 9.4 (*)    HCT 30.2 (*)    Platelets 456 (*)    All other components within normal limits    EKG  EKG Interpretation None       Radiology No results found.  Procedures .Epistaxis Management Date/Time: 09/04/2016 1:35 PM Performed by: Sherwood Gambler Authorized by: Sherwood Gambler   Consent:    Consent obtained:  Verbal Anesthesia (see MAR for exact dosages):    Anesthesia method:  None Procedure details:    Treatment site:  R anterior   Treatment method:  Silver nitrate   Treatment complexity:  Limited   Treatment episode: recurring   Post-procedure details:    Patient tolerance of procedure:  Tolerated well, no immediate complications   (including critical care time)  Medications Ordered in ED Medications  silver nitrate applicators applicator 1 Stick (1 Stick Topical Given by Other 09/04/16 1347)     Initial Impression / Assessment and Plan / ED  Course  I have reviewed the triage vital signs and the nursing notes.  Pertinent labs & imaging results that were available during my care of the patient were reviewed by me and considered in my medical decision making (see chart for details).  Clinical Course    No bleeding after silver nitrate was applied. Has not had any bleeding while in the ED. Her hemoglobin has dropped 1. over the last 11 days, hard to tell exactly when it has dropped. Given no acute bleeding and normal vital signs at this time (I think the initial BP was not her true BP- no  signs of hypotension) I think she can be discharged home. F/u with ENT. Return if symptoms recur or worsen.  Final Clinical Impressions(s) / ED Diagnoses   Final diagnoses:  Right-sided epistaxis    New Prescriptions New Prescriptions   No medications on file     Sherwood Gambler, MD 09/04/16 1421

## 2016-09-04 NOTE — ED Triage Notes (Signed)
Pt from [facility] , reports nose bleed x 6 in 2 weeks. No Hx HTN . No obvious bleeding , per EMS bleeding was controlled at the scene. denies further symptoms.

## 2019-01-15 ENCOUNTER — Encounter (HOSPITAL_COMMUNITY): Payer: Self-pay | Admitting: Emergency Medicine

## 2019-01-15 ENCOUNTER — Emergency Department (HOSPITAL_COMMUNITY): Payer: Medicare Other

## 2019-01-15 ENCOUNTER — Emergency Department (HOSPITAL_COMMUNITY)
Admission: EM | Admit: 2019-01-15 | Discharge: 2019-01-15 | Disposition: A | Discharge status: Home / Self Care | Payer: Medicare Other | Attending: Emergency Medicine | Admitting: Emergency Medicine

## 2019-01-15 ENCOUNTER — Other Ambulatory Visit: Payer: Self-pay

## 2019-01-15 DIAGNOSIS — Z7984 Long term (current) use of oral hypoglycemic drugs: Secondary | ICD-10-CM | POA: Diagnosis not present

## 2019-01-15 DIAGNOSIS — R29810 Facial weakness: Secondary | ICD-10-CM | POA: Diagnosis present

## 2019-01-15 DIAGNOSIS — G510 Bell's palsy: Secondary | ICD-10-CM | POA: Insufficient documentation

## 2019-01-15 DIAGNOSIS — F17200 Nicotine dependence, unspecified, uncomplicated: Secondary | ICD-10-CM | POA: Diagnosis not present

## 2019-01-15 DIAGNOSIS — I10 Essential (primary) hypertension: Secondary | ICD-10-CM | POA: Insufficient documentation

## 2019-01-15 DIAGNOSIS — Z7982 Long term (current) use of aspirin: Secondary | ICD-10-CM | POA: Diagnosis not present

## 2019-01-15 DIAGNOSIS — R479 Unspecified speech disturbances: Secondary | ICD-10-CM

## 2019-01-15 DIAGNOSIS — R200 Anesthesia of skin: Secondary | ICD-10-CM

## 2019-01-15 DIAGNOSIS — Z79899 Other long term (current) drug therapy: Secondary | ICD-10-CM | POA: Diagnosis not present

## 2019-01-15 DIAGNOSIS — F0390 Unspecified dementia without behavioral disturbance: Secondary | ICD-10-CM | POA: Diagnosis not present

## 2019-01-15 LAB — CBC
HEMATOCRIT: 41 % (ref 36.0–46.0)
Hemoglobin: 12.7 g/dL (ref 12.0–15.0)
MCH: 27.9 pg (ref 26.0–34.0)
MCHC: 31 g/dL (ref 30.0–36.0)
MCV: 90.1 fL (ref 80.0–100.0)
Platelets: 336 10*3/uL (ref 150–400)
RBC: 4.55 MIL/uL (ref 3.87–5.11)
RDW: 13.4 % (ref 11.5–15.5)
WBC: 8.3 10*3/uL (ref 4.0–10.5)
nRBC: 0 % (ref 0.0–0.2)

## 2019-01-15 LAB — COMPREHENSIVE METABOLIC PANEL
ALT: 27 U/L (ref 0–44)
AST: 25 U/L (ref 15–41)
Albumin: 3.6 g/dL (ref 3.5–5.0)
Alkaline Phosphatase: 68 U/L (ref 38–126)
Anion gap: 9 (ref 5–15)
BUN: 5 mg/dL — ABNORMAL LOW (ref 8–23)
CO2: 24 mmol/L (ref 22–32)
Calcium: 9.5 mg/dL (ref 8.9–10.3)
Chloride: 107 mmol/L (ref 98–111)
Creatinine, Ser: 0.73 mg/dL (ref 0.44–1.00)
GFR calc Af Amer: >60 mL/min (ref >60)
GFR calc non Af Amer: >60 mL/min (ref >60)
Glucose, Bld: 114 mg/dL — ABNORMAL HIGH (ref 70–99)
Potassium: 4 mmol/L (ref 3.5–5.1)
SODIUM: 140 mmol/L (ref 135–145)
Total Bilirubin: 0.3 mg/dL (ref 0.3–1.2)
Total Protein: 7.2 g/dL (ref 6.5–8.1)

## 2019-01-15 LAB — DIFFERENTIAL
Abs Immature Granulocytes: 0.02 10*3/uL (ref 0.00–0.07)
BASOS ABS: 0 10*3/uL (ref 0.0–0.1)
Basophils Relative: 1 %
Eosinophils Absolute: 0.1 10*3/uL (ref 0.0–0.5)
Eosinophils Relative: 1 %
IMMATURE GRANULOCYTES: 0 %
Lymphocytes Relative: 30 %
Lymphs Abs: 2.5 10*3/uL (ref 0.7–4.0)
Monocytes Absolute: 0.7 10*3/uL (ref 0.1–1.0)
Monocytes Relative: 8 %
NEUTROS ABS: 4.9 10*3/uL (ref 1.7–7.7)
Neutrophils Relative %: 60 %

## 2019-01-15 LAB — APTT: aPTT: 34 seconds (ref 24–36)

## 2019-01-15 LAB — I-STAT TROPONIN, ED: Troponin i, poc: 0 ng/mL (ref 0.00–0.08)

## 2019-01-15 LAB — PROTIME-INR
INR: 1.1
Prothrombin Time: 14.1 seconds (ref 11.4–15.2)

## 2019-01-15 LAB — CBG MONITORING, ED: Glucose-Capillary: 90 mg/dL (ref 70–99)

## 2019-01-15 MED ORDER — VALACYCLOVIR HCL 1 G PO TABS: 1000.0000 mg | ORAL_TABLET | Freq: Three times a day (TID) | ORAL | 0 refills | Status: DC

## 2019-01-15 MED ORDER — PREDNISONE 20 MG PO TABS: 40.0000 mg | ORAL_TABLET | Freq: Every day | ORAL | 0 refills | Status: DC

## 2019-01-15 MED ORDER — ARTIFICIAL TEARS OPHTHALMIC OINT: TOPICAL_OINTMENT | OPHTHALMIC | 0 refills | Status: DC | PRN

## 2019-01-15 NOTE — ED Notes (Signed)
Pt verbalized understanding of discharge paperwork, prescriptions and follow-up care 

## 2019-01-15 NOTE — ED Provider Notes (Signed)
  Physical Exam  BP (!) 128/59   Pulse 71   Resp 20   Wt 104.3 kg   LMP  (LMP Unknown)   SpO2 98%   BMI 38.26 kg/m   Physical Exam  ED Course/Procedures     Procedures  MDM  Patient has been seen by neurology.  Note reviewed.  Thinks it is Bell's not ischemic cause.  Steroids prescribed under neurology recommendation.  Is on metformin will need to watch sugars.  Discharge home.       Davonna Belling, MD 01/15/19 [phone]

## 2019-01-15 NOTE — ED Notes (Signed)
Pt family member

## 2019-01-15 NOTE — ED Provider Notes (Signed)
[facility] EMERGENCY DEPARTMENT Provider Note   CSN: [id number] Arrival date & time: 01/15/19  1020     History   Chief Complaint Chief Complaint  Patient presents with  . Facial Droop  . Headache    HPI Lindsey Garcia is a 63 y.o. female.  The history is provided by the patient and medical records. No language interpreter was used.  Neurologic Problem  This is a new problem. The current episode started yesterday (at least 24 hours). The problem occurs constantly. Associated symptoms include headaches. Pertinent negatives include no chest pain, no abdominal pain and no shortness of breath. Nothing aggravates the symptoms. Nothing relieves the symptoms. She has tried nothing for the symptoms. The treatment provided no relief.    Past Medical History:  Diagnosis Date  . Amnestic disorder   . Dementia ([place])   . Hypertension     There are no active problems to display for this patient.   History reviewed. No pertinent surgical history.   OB History   No obstetric history on file.      Home Medications    Prior to Admission medications   Medication Sig Start Date End Date Taking? Authorizing Provider  acetaminophen (TYLENOL) 500 MG tablet Take 500 mg by mouth every 6 (six) hours as needed for moderate pain.    [provider]  alum & mag hydroxide-simeth ([place]) 200-200-20 MG/5ML suspension Take 30 mLs by mouth every 6 (six) hours as needed for indigestion or heartburn.    [provider]  aspirin 81 MG chewable tablet Chew 81 mg by mouth daily.    [provider]  benztropine (COGENTIN) 1 MG tablet Take 1 mg by mouth 2 (two) times daily.    [provider]  bisacodyl (DULCOLAX) 5 MG EC tablet Take 10 mg by mouth daily as needed for moderate constipation.    [provider]  busPIRone (BUSPAR) 15 MG tablet Take 15 mg by mouth 2 (two) times daily.    [provider]  cholecalciferol (VITAMIN D)  1000 units tablet Take 1,000 Units by mouth daily.    [provider]  donepezil (ARICEPT) 10 MG tablet Take 10 mg by mouth at bedtime.    [provider]  guaifenesin (ROBITUSSIN) 100 MG/5ML syrup Take 200 mg by mouth 4 (four) times daily as needed for cough.    [provider]  lithium carbonate 300 MG capsule Take 600 mg by mouth at bedtime.    [provider]  loperamide (IMODIUM) 2 MG capsule Take 2 mg by mouth daily as needed for diarrhea or loose stools.    [provider]  magnesium hydroxide (MILK OF MAGNESIA) 400 MG/5ML suspension Take 30 mLs by mouth daily as needed for mild constipation.    [provider]  Melatonin 3 MG TABS Take 1 tablet by mouth daily.    [provider]  metFORMIN (GLUCOPHAGE) 500 MG tablet Take 500 mg by mouth 2 (two) times daily with a meal.    [provider]  metoprolol succinate (TOPROL-XL) 25 MG 24 hr tablet Take 25 mg by mouth 2 (two) times daily.    [provider]  Multiple Vitamin (MULTIVITAMIN WITH MINERALS) TABS tablet Take 1 tablet by mouth daily.    [provider]  pravastatin (PRAVACHOL) 20 MG tablet Take 20 mg by mouth daily.    [provider]  risperiDONE (RISPERDAL) 3 MG tablet Take 3 mg by mouth at bedtime.  [provider]  sodium chloride (OCEAN) 0.65 % SOLN nasal spray Place 1 spray into both nostrils 2 (two) times daily. 08/24/16   Quintella Reichert, MD  traZODone (DESYREL) 50 MG tablet Take 50 mg by mouth at bedtime as needed for sleep.    [provider]    Family History No family history on file.  Social History Social History   Tobacco Use  . Smoking status: Current Some Day Smoker  . Smokeless tobacco: Never Used  Substance Use Topics  . Alcohol use: No  . Drug use: Not on file     Allergies   Patient has no known allergies.   Review of Systems Review of Systems  Constitutional: Negative for chills,  diaphoresis and fatigue.  HENT: Negative for congestion.   Eyes: Negative for photophobia and visual disturbance.  Respiratory: Negative for cough, chest tightness, shortness of breath, wheezing and stridor.   Cardiovascular: Negative for chest pain.  Gastrointestinal: Negative for abdominal pain, constipation, diarrhea, nausea and vomiting.  Genitourinary: Negative for dysuria and frequency.  Musculoskeletal: Negative for back pain, neck pain and neck stiffness.  Skin: Negative for rash and wound.  Neurological: Positive for facial asymmetry, speech difficulty, weakness (r face), numbness and headaches. Negative for dizziness and light-headedness.  Psychiatric/Behavioral: Negative for agitation and confusion.  All other systems reviewed and are negative.    Physical Exam Updated Vital Signs Wt 104.3 kg   LMP  (LMP Unknown)   SpO2 97%   BMI 38.26 kg/m   Physical Exam Vitals signs and nursing note reviewed.  Constitutional:      General: She is not in acute distress.    Appearance: She is well-developed. She is not ill-appearing, toxic-appearing or diaphoretic.  HENT:     Head: Atraumatic.     Right Ear: External ear normal.     Left Ear: External ear normal.     Nose: Nose normal.     Mouth/Throat:     Mouth: Mucous membranes are moist.     Pharynx: No oropharyngeal exudate.  Eyes:     Extraocular Movements: Extraocular movements intact.     Conjunctiva/sclera: Conjunctivae normal.     Pupils: Pupils are equal, round, and reactive to light.  Neck:     Musculoskeletal: Normal range of motion and neck supple.  Cardiovascular:     Rate and Rhythm: Normal rate.     Heart sounds: Normal heart sounds. No murmur.  Pulmonary:     Effort: No respiratory distress.     Breath sounds: No stridor.  Abdominal:     General: There is no distension.     Tenderness: There is no abdominal tenderness. There is no rebound.  Musculoskeletal:        General: No swelling or tenderness.    Skin:    General: Skin is warm.     Findings: No erythema or rash.  Neurological:     Mental Status: She is alert and oriented to person, place, and time.     GCS: GCS eye subscore is 4. GCS verbal subscore is 5. GCS motor subscore is 6.     Cranial Nerves: Facial asymmetry present. No dysarthria.     Sensory: Sensory deficit present.     Motor: No tremor or abnormal muscle tone.     Coordination: Coordination normal.     Deep Tendon Reflexes: Reflexes are normal and symmetric.     Comments: Right-sided facial droop.  Upper face is involved.  Her right eyebrow  weakness.  Patient can close her eye with effort.  Normal extraocular movements.  Decreased sensation on right face.  Clear speech.  No expressive aphasia for me.  Neck nontender.  Otherwise normal neurologic exam.  Psychiatric:        Mood and Affect: Mood normal. Mood is not anxious.        Speech: Speech normal.      ED Treatments / Results  Labs (all labs ordered are listed, but only abnormal results are displayed) Labs Reviewed  COMPREHENSIVE METABOLIC PANEL - Abnormal; Notable for the following components:      Result Value   Glucose, Bld 114 (*)    BUN 5 (*)    All other components within normal limits  PROTIME-INR  APTT  CBC  DIFFERENTIAL  I-STAT TROPONIN, ED  CBG MONITORING, ED    EKG EKG Interpretation  Date/Time:  Thursday January 15 2019 10:25:41 EST Ventricular Rate:  72 PR Interval:    QRS Duration: 90 QT Interval:  383 QTC Calculation: 420 R Axis:   57 Text Interpretation:  Sinus rhythm Low voltage, precordial leads Borderline T abnormalities, anterior leads No prior ECG for comparison.  No STEMI Confirmed by Antony Blackbird [id number]) on 01/15/2019 10:28:42 AM Also confirmed by Antony Blackbird [phone]), editor [address], Janett Billow [phone])  on 01/15/2019 11:28:57 AM   Radiology Ct Head Wo Contrast  Result Date: 01/15/2019 CLINICAL DATA:  Right facial droop and slurred speech beginning yesterday morning.  EXAM: CT HEAD WITHOUT CONTRAST TECHNIQUE: Contiguous axial images were obtained from the base of the skull through the vertex without intravenous contrast. COMPARISON:  None. FINDINGS: Brain: No evidence of acute infarction, hemorrhage, hydrocephalus, extra-axial collection or mass lesion/mass effect. Vascular: No hyperdense vessel or unexpected calcification. Skull: Intact.  No focal lesion. Sinuses/Orbits: Small right mastoid effusion noted. Other: None. IMPRESSION: No acute abnormality. Small right mastoid effusion. Electronically Signed   By: Inge Rise M.D.   On: 01/15/2019 12:59    Procedures Procedures (including critical care time)  Medications Ordered in ED Medications - No data to display   Initial Impression / Assessment and Plan / ED Course  I have reviewed the triage vital signs and the nursing notes.  Pertinent labs & imaging results that were available during my care of the patient were reviewed by me and considered in my medical decision making (see chart for details).     Lindsey Garcia is a 63 y.o. female with a past medical history sniffing for dementia and hypertension who presents with right facial droop and reported speech abnormality.  Patient was last seen normal several days ago by family but according to the facility report to EMS, patient has had greater than 24 hours of right-sided facial droop and intermittent episodes of difficulty speaking.  She has not had any other weakness.  No reported recent trauma.  Patient told them that she was having headache this morning that was mild.  She denies any recent medication changes or other complaints.  She denies any nausea vomiting, vision changes, numbness, tingling, or weakness of extremities.  Patient does report right-sided facial numbness.  No history of Bell's palsy or stroke in the past.  On exam, patient has right-sided facial droop and right facial numbness.  Patient does have involvement of the right upper  face.  Patient is able to close her eye.  Patient is normal extraocular movements and pupils are symmetric.  Symmetric grip strength with no numbness in her arms.  Normal leg strength  with normal sensation in the legs.  Lungs clear and chest is nontender.  Back and abdomen nontender.  Normal neck range of motion.  Clinically I suspect patient has a Bell's palsy however given the report that patient had transient speech abnormalities with this and the numbness she is having, we will touch base with neurology to discuss further imaging passed a CT scan with a headache.  CT of the head showed no acute abnormality and no evidence of intracranial hemorrhage.  Neurology will see the patient to determine if MRI would be beneficial versus discharge with further management of presumptive Bell's palsy.  4:02 PM Care transferred to Dr. Alvino Chapel while awaiting for neurology recommendations in regards to need for MRI or discharge for Bell's palsy management as an outpatient.  Anticipate following up on recommendations.   Final Clinical Impressions(s) / ED Diagnoses   Final diagnoses:  Facial droop  Right facial numbness  Transient speech disturbance    ED Discharge Orders    None     Clinical Impression: 1. Facial droop   2. Right facial numbness   3. Transient speech disturbance     Disposition: Care transferred while awaiting recommendations by neurology.  This note was prepared with assistance of Systems analyst. Occasional wrong-word or sound-a-like substitutions may have occurred due to the inherent limitations of voice recognition software.      Shadee Rathod, Gwenyth Allegra, MD 01/15/19 2018

## 2019-01-15 NOTE — ED Notes (Signed)
[facility] with [facility] [phone]

## 2019-01-15 NOTE — ED Triage Notes (Addendum)
Pt in from [facility] via [place] with stroke-like symptoms since "yesterday morning" per staff, >24h. Has R side facial droop and slurred speech per staff. A&ox4, gait steady, no other apparent weakness. Reports waking with a sharp frontal HA this am at 0800

## 2019-01-15 NOTE — Consult Note (Signed)
NEURO HOSPITALIST CONSULT NOTE   Requesting physician: Dr. Sherry Ruffing  Reason for Consult: Right facial droop which includes right forehead weakness.   History obtained from:  Patient, chart and family members  HPI:                                                                                                                                          Lindsey Garcia is an 63 y.o. female who presents to the ED from [facility] via EMS with right facial droop since yesterday morning, per staff at the facility. Per family at bedside they noted a slight facial droop at least 3 days ago which has progressingly worsened. She has right upper and lower face weakness and inability to completely close right eye and there is tearing as well. The pt and family deny that there is a speech deficit; she sounds like her baseline speech with slight dysarthria due to multiple teeth missing. She did state that she awoke with a sharp frontal headache this morning at 0800. When asked she does also note that she has had pain behind her right ear. However, this has improved throughout the day.   Past Medical History:  Diagnosis Date  . Amnestic disorder   . Dementia ([place])   . Hypertension   . Alzheimer's dementia  . Anxiety  . Diabetes (CMS-HCC)  . Hyperlipidemia  . Hypertension  . Major depressive disorder  . Osteoarthritis  . Psychiatric Medication Trials  . Schizophrenia (CMS-HCC)  . Thyroid nodule   Past Surgical History: Tubal ligation   No family history on file.   This is unknown to pt  Social History: Since living at [facility], she has quit smoking and drinking ETOH. Per family she smoked 1-2 PPD for many years and was an alcoholic. She has never used smokeless tobacco. No current use of illicit drugs.   No Known Allergies  MEDICATIONS:     List reviewed from family.                                                                                                               ROS:  History obtained from pt  General ROS: negative for current- chills, fatigue, fever, night sweats, weight gain or weight loss, but c/o recent sore throat type illness Psychological ROS: negative for current behavioral disorder, hallucinations, however she has baseline memory difficulties, mood swings Ophthalmic ROS: negative for - blurry vision, double vision, eye pain or loss of vision, Does c/o right eye watering and inability to fully close ENT ROS: negative for - epistaxis, nasal discharge, oral lesions, sore throat, tinnitus  Respiratory ROS: negative for - cough, hemoptysis, shortness of breath or wheezing Cardiovascular ROS: negative for - chest pain, dyspnea on exertion, edema or irregular heartbeat Gastrointestinal ROS: negative for - abdominal pain, diarrhea, nausea/vomiting or stool incontinence Genito-Urinary ROS: negative for - dysuria, incontinence or urinary frequency/urgency Musculoskeletal ROS: negative for - joint swelling or muscular weakness Neurological ROS: as noted in HPI Dermatological ROS: negative for rash and skin lesion changes   Blood pressure (!) 128/59, pulse 71, resp. rate 20, weight 104.3 kg, SpO2 98 %.   General Examination:                                                                                                       Physical Exam  HEENT-  Normocephalic. Tearing from right eye noted.  Otoscopic exam is wnl, no vesicles in right EAC and tympanic membrane is normal in appearance.   Cardiovascular- S1-S2 audible, pulses palpable throughout   Lungs-no rhonchi or wheezing noted, no excessive work of breathing. Saturations within normal limits Abdomen- All 4 quadrants palpated and nontender Extremities- Warm, dry and intact, slight 1+ swelling noted in bilat ankles Musculoskeletal-no joint tenderness or deformity   Skin-warm and dry, no hyperpigmentation, vitiligo, or suspicious lesions  Neurological Examination Mental Status: Alert, oriented, thought content appropriate.  Speech with slight dysarthria (per pt/family this is her baseline d/t missing several teeth). No evidence of aphasia.  Able to follow all commands without difficulty.  Cranial Nerves: II: Visual fields grossly normal. PERRL.   III,IV, VI: EOMI without nystagmus. Eyelids without weakness of opening V: Temp sensation equal bilaterally.  VII: smile asymmetric with weakness of right lower quadrant of face. Also present is right forehead weakness. Right palpebral fissure is wider than on the left. Has weak eyelid closure on the right. Taste sensation to sweet is equal bilaterally.  VIII: No hyperacusis on the right.  IX,X: Palate rises symmetrically XI: Symmetric XII: Midline tongue extension Motor: Right : Upper extremity   5/5    Left:     Upper extremity   5/5  Lower extremity   5/5     Lower extremity   5/5 No asymmetry noted.  Normal tone throughout; no atrophy noted No pronator drift.  Sensory:  Light touch decreased in bilat feet, slightly worse in right distally up to ankle in stocking like distribution.  (per pt this has been chronic and not a new finding). No lateralized sensory deficit to thighs or upper extremities.  Deep Tendon Reflexes: 1+ and symmetric throughout Plantars: Right: downgoing   Left: downgoing Cerebellar: Normal finger-to-nose, normal rapid alternating movements  and normal heel-to-shin test Gait: Deferred   Lab Results: Basic Metabolic Panel: Recent Labs  Lab 01/15/19 1037  NA 140  K 4.0  CL 107  CO2 24  GLUCOSE 114*  BUN 5*  CREATININE 0.73  CALCIUM 9.5    CBC: Recent Labs  Lab 01/15/19 1037  WBC 8.3  NEUTROABS 4.9  HGB 12.7  HCT 41.0  MCV 90.1  PLT 336    Cardiac Enzymes: No results for input(s): CKTOTAL, CKMB, CKMBINDEX, TROPONINI in the last 168 hours.  Lipid Panel: No  results for input(s): CHOL, TRIG, HDL, CHOLHDL, VLDL, LDLCALC in the last 168 hours.  Imaging: Ct Head Wo Contrast  Result Date: 01/15/2019 CLINICAL DATA:  Right facial droop and slurred speech beginning yesterday morning. EXAM: CT HEAD WITHOUT CONTRAST TECHNIQUE: Contiguous axial images were obtained from the base of the skull through the vertex without intravenous contrast. COMPARISON:  None. FINDINGS: Brain: No evidence of acute infarction, hemorrhage, hydrocephalus, extra-axial collection or mass lesion/mass effect. Vascular: No hyperdense vessel or unexpected calcification. Skull: Intact.  No focal lesion. Sinuses/Orbits: Small right mastoid effusion noted. Other: None. IMPRESSION: No acute abnormality. Small right mastoid effusion. Electronically Signed   By: Inge Rise M.D.   On: 01/15/2019 12:59    Assessment:  1. Bell's Palsy- right upper and lower face paralysis and right eyelid closure affected.  2. Dementia, Schizophrenia, bipolar- monitor for manic symptoms during steroid use 3. DM2- monitor blood sugars carefully while on steroids  Recommendations: 1. Prednisone 60 mg po qd x 6 days then taper over the next five days (50, 40, 30, 20, 10, then stop).  2. Monitor carefully for increased agitation or mania while on prednisone, given her underlying psychiatric diagnoses 3. Valacyclovir 1 g po TID x 7 days  4. Lacrilube drops to right eye during the day 5. Tape right eye shut when sleeping to avoid corneal abrasion.    Desiree Metzger-Cihelka, ARNP-C, ANVP-BC 01/15/2019, 2:07 PM  I have interviewed and examined the patient. I have performed the Neurological exam, which was documented by the Neurohospitalist NP. I have formulated the assessment and recommendations.  Electronically signed: Dr. Kerney Elbe

## 2019-01-16 NOTE — Progress Notes (Signed)
Valacyclovir prescription has been faxed to her SNF [facility]. Receipt of fax verified by telephone.   Electronically signed: Dr. Kerney Elbe

## 2019-05-14 ENCOUNTER — Other Ambulatory Visit: Payer: Self-pay

## 2019-05-14 ENCOUNTER — Emergency Department (HOSPITAL_COMMUNITY)
Admission: EM | Admit: 2019-05-14 | Discharge: 2019-05-14 | Disposition: A | Discharge status: Home / Self Care | Payer: Medicare Other | Attending: Emergency Medicine | Admitting: Emergency Medicine

## 2019-05-14 DIAGNOSIS — F0390 Unspecified dementia without behavioral disturbance: Secondary | ICD-10-CM | POA: Diagnosis not present

## 2019-05-14 DIAGNOSIS — R4689 Other symptoms and signs involving appearance and behavior: Secondary | ICD-10-CM | POA: Diagnosis not present

## 2019-05-14 DIAGNOSIS — Z7984 Long term (current) use of oral hypoglycemic drugs: Secondary | ICD-10-CM | POA: Diagnosis not present

## 2019-05-14 DIAGNOSIS — Z7982 Long term (current) use of aspirin: Secondary | ICD-10-CM | POA: Insufficient documentation

## 2019-05-14 DIAGNOSIS — F17200 Nicotine dependence, unspecified, uncomplicated: Secondary | ICD-10-CM | POA: Insufficient documentation

## 2019-05-14 DIAGNOSIS — I10 Essential (primary) hypertension: Secondary | ICD-10-CM | POA: Insufficient documentation

## 2019-05-14 DIAGNOSIS — Z79899 Other long term (current) drug therapy: Secondary | ICD-10-CM | POA: Diagnosis not present

## 2019-05-14 NOTE — ED Notes (Signed)
Discharged per Dr. She has been calm and stable while here, no evidence of psychosis, no SI or HI. She has dementia, unsure as to why she is here, unsure if staff was good to her or not, states "I guess I will just have to follow the rules." [facility] here to transport her back to [facility] care which is where she came from. She had no property to return to her. She was informed of where she was going and said she was happy to be going home.

## 2019-05-14 NOTE — ED Notes (Signed)
Bed: WA29 Expected date:  Expected time:  Means of arrival:  Comments: EMS psych eval

## 2019-05-14 NOTE — ED Notes (Signed)
[facility] called for transport back to [facility].  

## 2019-05-14 NOTE — Discharge Instructions (Addendum)
Talk with the patient's son.  Patient appears stable at this time. Has history of mild aggression in past. Likely dementia related. She denies any homicidal or suicidal ideation.  Make sure she continues to take her medications daily.  Have her follow-up closely with her primary care doctor or psychiatrist.  There is no acute mania or psychosis or reason to have her admitted to a psychiatric hospital at this time.  Continue to make sure patient is eating and drinking well.

## 2019-05-14 NOTE — ED Provider Notes (Signed)
[facility] DEPT Provider Note   CSN: [id number] Arrival date & time: 05/14/19  1706    History   Chief Complaint Chief Complaint  Patient presents with  . Medical Clearance    HPI Lindsey Garcia is a 63 y.o. female.     The history is provided by the patient and a caregiver.  Mental Health Problem  Presenting symptoms: aggressive behavior   Degree of incapacity (severity):  Mild Onset quality:  Sudden Progression:  Resolved Chronicity:  Recurrent Context: not medication   Treatment compliance:  All of the time Associated symptoms: no abdominal pain and no chest pain   Risk factors comment:  Hx of dementia   Past Medical History:  Diagnosis Date  . Amnestic disorder   . Dementia ([place])   . Hypertension     There are no active problems to display for this patient.   No past surgical history on file.   OB History   No obstetric history on file.      Home Medications    Prior to Admission medications   Medication Sig Start Date End Date Taking? Authorizing Provider  acetaminophen (TYLENOL) 500 MG tablet Take 500 mg by mouth every 6 (six) hours as needed for moderate pain.    [provider]  alum & mag hydroxide-simeth ([place]) 200-200-20 MG/5ML suspension Take 30 mLs by mouth every 6 (six) hours as needed for indigestion or heartburn.    [provider]  artificial tears (LACRILUBE) OINT ophthalmic ointment Place into both eyes every 4 (four) hours as needed for dry eyes. 01/15/19   Davonna Belling, MD  aspirin 81 MG chewable tablet Chew 81 mg by mouth daily.    [provider]  benztropine (COGENTIN) 1 MG tablet Take 1 mg by mouth 2 (two) times daily.    [provider]  bisacodyl (DULCOLAX) 5 MG EC tablet Take 10 mg by mouth daily as needed for moderate constipation.    [provider]  busPIRone (BUSPAR) 15 MG tablet Take 15 mg by mouth 2 (two) times daily.    [provider]  cholecalciferol (VITAMIN D) 1000 units tablet Take 1,000 Units by mouth daily.    [provider]  donepezil (ARICEPT) 10 MG tablet Take 10 mg by mouth at bedtime.    [provider]  guaifenesin (ROBITUSSIN) 100 MG/5ML syrup Take 200 mg by mouth 4 (four) times daily as needed for cough.    [provider]  lithium carbonate 300 MG capsule Take 600 mg by mouth at bedtime.    [provider]  loperamide (IMODIUM) 2 MG capsule Take 2 mg by mouth daily as needed for diarrhea or loose stools.    [provider]  magnesium hydroxide (MILK OF MAGNESIA) 400 MG/5ML suspension Take 30 mLs by mouth daily as needed for mild constipation.    [provider]  Melatonin 3 MG TABS Take 1 tablet by mouth daily.    [provider]  metFORMIN (GLUCOPHAGE) 500 MG tablet Take 500 mg by mouth 2 (two) times daily with a meal.    [provider]  metoprolol succinate (TOPROL-XL) 25 MG 24 hr tablet Take 25 mg by mouth 2 (two) times daily.    [provider]  Multiple Vitamin (MULTIVITAMIN WITH MINERALS) TABS tablet Take 1 tablet by mouth daily.    [provider]  pravastatin (PRAVACHOL) 20 MG tablet Take 20 mg by mouth daily.    [provider]  predniSONE (DELTASONE) 20 MG tablet Take 2 tablets (40 mg total) by mouth daily. 01/15/19   Davonna Belling, MD  risperiDONE (RISPERDAL) 3 MG tablet Take 3 mg by mouth at bedtime.    [provider]  sodium chloride (OCEAN) 0.65 % SOLN nasal spray Place 1 spray into both nostrils 2 (two) times daily. 08/24/16   Quintella Reichert, MD  traZODone (DESYREL) 50 MG tablet Take 50 mg by mouth at bedtime as needed for sleep.    [provider]  valACYclovir (VALTREX) 1000 MG tablet Take 1 tablet (1,000 mg total) by mouth 3 (three) times daily. 01/15/19   Davonna Belling, MD    Family History No family history on file.  Social History Social History    Tobacco Use  . Smoking status: Current Some Day Smoker  . Smokeless tobacco: Never Used  Substance Use Topics  . Alcohol use: No  . Drug use: Not on file     Allergies   Patient has no known allergies.   Review of Systems Review of Systems  Constitutional: Negative for chills and fever.  HENT: Negative for ear pain and sore throat.   Eyes: Negative for pain and visual disturbance.  Respiratory: Negative for cough and shortness of breath.   Cardiovascular: Negative for chest pain and palpitations.  Gastrointestinal: Negative for abdominal pain and vomiting.  Genitourinary: Negative for dysuria and hematuria.  Musculoskeletal: Negative for arthralgias and back pain.  Skin: Negative for color change and rash.  Neurological: Negative for seizures and syncope.  All other systems reviewed and are negative.    Physical Exam Updated Vital Signs BP 94/78 (BP Location: Right Arm)   Pulse 79   Temp 98.2 F (36.8 C) (Oral)   Resp 18   LMP  (LMP Unknown)   SpO2 98%   Physical Exam Vitals signs and nursing note reviewed.  Constitutional:      General: She is not in acute distress.    Appearance: She is well-developed.  HENT:     Head: Normocephalic and atraumatic.     Nose: Nose normal.     Mouth/Throat:     Mouth: Mucous membranes are moist.  Eyes:     Conjunctiva/sclera: Conjunctivae normal.     Pupils: Pupils are equal, round, and reactive to light.  Neck:     Musculoskeletal: Normal range of motion and neck supple.  Cardiovascular:     Rate and Rhythm: Normal rate and regular rhythm.     Pulses: Normal pulses.     Heart sounds: Normal heart sounds. No murmur.  Pulmonary:     Effort: Pulmonary effort is normal. No respiratory distress.     Breath sounds: Normal breath sounds.  Abdominal:     General: There is no distension.     Palpations: Abdomen is soft.     Tenderness: There is no abdominal tenderness.  Skin:    General: Skin is warm and dry.  Neurological:      General: No focal deficit present.     Mental Status: She is alert.     Comments: Can tell me her name and where she lives  Psychiatric:        Attention and Perception: Attention normal.        Mood and Affect: Mood normal.        Speech: Speech normal.        Behavior: Behavior normal.        Thought Content: Thought content does not include homicidal or  suicidal ideation. Thought content does not include homicidal or suicidal plan.        Judgment: Judgment normal.      ED Treatments / Results  Labs (all labs ordered are listed, but only abnormal results are displayed) Labs Reviewed - No data to display  EKG None  Radiology No results found.  Procedures Procedures (including critical care time)  Medications Ordered in ED Medications - No data to display   Initial Impression / Assessment and Plan / ED Course  I have reviewed the triage vital signs and the nursing notes.  Pertinent labs & imaging results that were available during my care of the patient were reviewed by me and considered in my medical decision making (see chart for details).     Lindsey Garcia is a 63 year old female history of dementia who presents to the ED for evaluation from nursing home.  Facility called the police because the patient was cursing.  EMS states that patient was using foul language and being disruptive at her nursing home and was sent for evaluation.  Here patient is pleasant and cooperative.  She can tell me her name and that she is in the hospital.  She can tell me where she lives.  I talked with her son on the phone who states that times that she does get intermittently verbally abusive but has never been violent at this facility before.  Patient was given something to eat and was mentally stable throughout my care.  There is no sign to suggest any type of manic behavior or aggressive behavior.  Patient has normal vitals.  No fever.  Multiple attempts were made to touch base with  the facility but was unable to get in touch with them.  The patient's son received a phone call from the facility making them aware that she is being verbally abusive.  At this time believe she can follow-up with her geriatrician.  Patient discharged in good condition.  This chart was dictated using voice recognition software.  Despite best efforts to proofread,  errors can occur which can change the documentation meaning.     Final Clinical Impressions(s) / ED Diagnoses   Final diagnoses:  Aggression    ED Discharge Orders    None       Lennice Sites, DO 05/14/19 1804

## 2019-05-14 NOTE — ED Triage Notes (Signed)
EMS states that facility called police because patient was cursing.  Police called EMS.  She states she is here because "They thought I was acting crazy"  EMS states she was using foul language.  Pt is pleasant and cooperative.

## 2021-01-30 ENCOUNTER — Other Ambulatory Visit: Payer: Self-pay | Admitting: Physician Assistant

## 2021-01-30 DIAGNOSIS — Z1231 Encounter for screening mammogram for malignant neoplasm of breast: Secondary | ICD-10-CM

## 2021-03-20 ENCOUNTER — Other Ambulatory Visit: Payer: Self-pay

## 2021-03-20 ENCOUNTER — Ambulatory Visit
Admission: RE | Admit: 2021-03-20 | Discharge: 2021-03-20 | Disposition: A | Discharge status: Home / Self Care | Payer: Medicare Other | Source: Ambulatory Visit | Attending: Physician Assistant | Admitting: Physician Assistant

## 2021-03-20 DIAGNOSIS — Z1231 Encounter for screening mammogram for malignant neoplasm of breast: Secondary | ICD-10-CM

## 2021-03-23 ENCOUNTER — Other Ambulatory Visit: Payer: Self-pay | Admitting: Physician Assistant

## 2021-03-23 DIAGNOSIS — R928 Other abnormal and inconclusive findings on diagnostic imaging of breast: Secondary | ICD-10-CM

## 2021-04-13 ENCOUNTER — Other Ambulatory Visit: Payer: Self-pay | Admitting: Physician Assistant

## 2021-04-13 ENCOUNTER — Other Ambulatory Visit: Payer: Self-pay

## 2021-04-13 ENCOUNTER — Ambulatory Visit
Admission: RE | Admit: 2021-04-13 | Discharge: 2021-04-13 | Disposition: A | Discharge status: Home / Self Care | Payer: Medicare Other | Source: Ambulatory Visit | Attending: Physician Assistant | Admitting: Physician Assistant

## 2021-04-13 DIAGNOSIS — R928 Other abnormal and inconclusive findings on diagnostic imaging of breast: Secondary | ICD-10-CM

## 2021-04-26 ENCOUNTER — Ambulatory Visit
Admission: RE | Admit: 2021-04-26 | Discharge: 2021-04-26 | Disposition: A | Discharge status: Home / Self Care | Payer: Medicare Other | Source: Ambulatory Visit | Attending: Physician Assistant | Admitting: Physician Assistant

## 2021-04-26 ENCOUNTER — Other Ambulatory Visit: Payer: Self-pay

## 2021-04-26 DIAGNOSIS — R928 Other abnormal and inconclusive findings on diagnostic imaging of breast: Secondary | ICD-10-CM

## 2021-05-08 ENCOUNTER — Encounter: Payer: Self-pay | Admitting: *Deleted

## 2021-05-08 DIAGNOSIS — D0511 Intraductal carcinoma in situ of right breast: Secondary | ICD-10-CM | POA: Insufficient documentation

## 2021-05-09 ENCOUNTER — Other Ambulatory Visit: Payer: Self-pay | Admitting: General Surgery

## 2021-05-09 DIAGNOSIS — C50411 Malignant neoplasm of upper-outer quadrant of right female breast: Secondary | ICD-10-CM

## 2021-05-10 ENCOUNTER — Encounter: Payer: Self-pay | Admitting: General Surgery

## 2021-05-10 ENCOUNTER — Ambulatory Visit: Payer: Medicare Other | Admitting: Physical Therapy

## 2021-05-10 ENCOUNTER — Other Ambulatory Visit: Payer: Self-pay

## 2021-05-10 ENCOUNTER — Ambulatory Visit
Admission: RE | Admit: 2021-05-10 | Discharge: 2021-05-10 | Disposition: A | Discharge status: Home / Self Care | Payer: Medicare Other | Source: Ambulatory Visit | Attending: Radiation Oncology | Admitting: Radiation Oncology

## 2021-05-10 ENCOUNTER — Encounter: Payer: Self-pay | Admitting: General Practice

## 2021-05-10 ENCOUNTER — Inpatient Hospital Stay: Payer: Medicare Other | Attending: Hematology and Oncology | Admitting: Hematology and Oncology

## 2021-05-10 ENCOUNTER — Inpatient Hospital Stay: Payer: Medicare Other

## 2021-05-10 DIAGNOSIS — F17210 Nicotine dependence, cigarettes, uncomplicated: Secondary | ICD-10-CM | POA: Diagnosis not present

## 2021-05-10 DIAGNOSIS — Z170 Estrogen receptor positive status [ER+]: Secondary | ICD-10-CM | POA: Diagnosis not present

## 2021-05-10 DIAGNOSIS — F0390 Unspecified dementia without behavioral disturbance: Secondary | ICD-10-CM | POA: Insufficient documentation

## 2021-05-10 DIAGNOSIS — D0511 Intraductal carcinoma in situ of right breast: Secondary | ICD-10-CM

## 2021-05-10 LAB — CMP (CANCER CENTER ONLY)
ALT: 9 U/L (ref 0–44)
AST: 12 U/L — ABNORMAL LOW (ref 15–41)
Albumin: 3.5 g/dL (ref 3.5–5.0)
Alkaline Phosphatase: 85 U/L (ref 38–126)
Anion gap: 10 (ref 5–15)
BUN: 11 mg/dL (ref 8–23)
CO2: 24 mmol/L (ref 22–32)
Calcium: 9.8 mg/dL (ref 8.9–10.3)
Chloride: 106 mmol/L (ref 98–111)
Creatinine: 0.85 mg/dL (ref 0.44–1.00)
GFR, Estimated: >60 mL/min (ref >60)
Glucose, Bld: 157 mg/dL — ABNORMAL HIGH (ref 70–99)
Potassium: 4.3 mmol/L (ref 3.5–5.1)
Sodium: 140 mmol/L (ref 135–145)
Total Bilirubin: <0.2 mg/dL — ABNORMAL LOW (ref 0.3–1.2)
Total Protein: 7.6 g/dL (ref 6.5–8.1)

## 2021-05-10 LAB — CBC WITH DIFFERENTIAL (CANCER CENTER ONLY)
Abs Immature Granulocytes: 0.02 10*3/uL (ref 0.00–0.07)
Basophils Absolute: 0 10*3/uL (ref 0.0–0.1)
Basophils Relative: 1 %
Eosinophils Absolute: 0.1 10*3/uL (ref 0.0–0.5)
Eosinophils Relative: 1 %
HCT: 38.5 % (ref 36.0–46.0)
Hemoglobin: 12.1 g/dL (ref 12.0–15.0)
Immature Granulocytes: 0 %
Lymphocytes Relative: 29 %
Lymphs Abs: 2.4 10*3/uL (ref 0.7–4.0)
MCH: 28.2 pg (ref 26.0–34.0)
MCHC: 31.4 g/dL (ref 30.0–36.0)
MCV: 89.7 fL (ref 80.0–100.0)
Monocytes Absolute: 0.5 10*3/uL (ref 0.1–1.0)
Monocytes Relative: 7 %
Neutro Abs: 5.2 10*3/uL (ref 1.7–7.7)
Neutrophils Relative %: 62 %
Platelet Count: 360 10*3/uL (ref 150–400)
RBC: 4.29 MIL/uL (ref 3.87–5.11)
RDW: 12.9 % (ref 11.5–15.5)
WBC Count: 8.4 10*3/uL (ref 4.0–10.5)
nRBC: 0 % (ref 0.0–0.2)

## 2021-05-10 LAB — GENETIC SCREENING ORDER

## 2021-05-10 NOTE — Progress Notes (Signed)
[facility] Psychosocial Distress Screening Spiritual Care  Met with Lindsey Garcia and her daughter/caregiver Lindsey Garcia in [facility] to introduce [facility] team/resources, reviewing distress screen per protocol.  The patient scored a 1 on the Psychosocial Distress Thermometer which indicates mild distress. Also assessed for distress and other psychosocial needs.   ONCBCN DISTRESS SCREENING 05/10/2021  Screening Type Initial Screening  Distress experienced in past week (1-10) 1  Referral to support programs Yes   Daughter Lindsey Garcia notes that [name] lives in a nursing home and is unlikely to remember if she feels distress. Introduced Set designer as resources available both to patients and to caregivers.   Follow up needed: No. Patient and family report no other needs or concerns at this time, but know to reach out as desired if needs arise or circumstances change.   [place], [place], [facility] Pager [phone] Voicemail [phone]

## 2021-05-10 NOTE — Progress Notes (Signed)
[facility] NOTE  Patient Care Team: Sande Brothers, MD as PCP - General (Internal Medicine) Mauro Kaufmann, RN as Oncology Nurse Navigator Rockwell Germany, RN as Oncology Nurse Navigator Stark Klein, MD as Consulting Physician (General Surgery) Nicholas Lose, MD as Consulting Physician (Hematology and Oncology) Eppie Gibson, MD as Attending Physician (Radiation Oncology)  CHIEF COMPLAINTS/PURPOSE OF CONSULTATION:  Newly diagnosed breast cancer  HISTORY OF PRESENTING ILLNESS:  Lindsey Garcia 65 y.o. female is here because of recent diagnosis of right breast DCIS.  Patient had a screening mammogram that detected distortion in the right breast and asymmetry which her ultrasound revealed calcifications measuring 0.5 cm.  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  She was (morning of multidisciplinary tumor board and she is here today accompanied by her caregiver to discuss her treatment plan.  She is very pleasantly demented and probably does not fully understand the consequences of treatment decisions.  Her daughter is her primary caregiver.   I reviewed her records extensively and collaborated the history with the patient.  SUMMARY OF ONCOLOGIC HISTORY: Oncology History  Ductal carcinoma in situ (DCIS) of right breast  04/26/2021 Initial Diagnosis   Screening mammogram detected bilateral breast distortion and asymmetry, left breast mass: 9 o'clock position: 0.5 cm, axilla negative: Biopsy revealed intermediate grade DCIS and a complex sclerosing lesion, ER 95%, PR 80%   05/10/2021 Cancer Staging   Staging form: Breast, AJCC 8th Edition - Clinical stage from 05/10/2021: Stage 0 (cTis (DCIS), cN0, cM0, G2, ER+, PR+, HER2: Not Assessed) - Signed by Nicholas Lose, MD on 05/10/2021 Stage prefix: Initial diagnosis Histologic grading system: 3 grade system      MEDICAL HISTORY:  Past Medical History:  Diagnosis Date   . Amnestic disorder   . Dementia ([place])   . Hypertension     SURGICAL HISTORY: No past surgical history on file.  SOCIAL HISTORY: Social History   Socioeconomic History  . Marital status: Single    Spouse name: Not on file  . Number of children: Not on file  . Years of education: Not on file  . Highest education level: Not on file  Occupational History  . Not on file  Tobacco Use  . Smoking status: Current Some Day Smoker  . Smokeless tobacco: Never Used  Substance and Sexual Activity  . Alcohol use: No  . Drug use: Not on file  . Sexual activity: Not on file  Other Topics Concern  . Not on file  Social History Narrative  . Not on file   Social Determinants of Health   Financial Resource Strain: Not on file  Food Insecurity: Not on file  Transportation Needs: Not on file  Physical Activity: Not on file  Stress: Not on file  Social Connections: Not on file  Intimate Partner Violence: Not on file    FAMILY HISTORY: No family history on file.  ALLERGIES:  has No Known Allergies.  MEDICATIONS:  Current Outpatient Medications  Medication Sig Dispense Refill  . acetaminophen (TYLENOL) 500 MG tablet Take 500 mg by mouth every 6 (six) hours as needed for moderate pain.    Marland Kitchen alum & mag hydroxide-simeth ([place]) 166-063-01 MG/5ML suspension Take 30 mLs by mouth every 6 (six) hours as needed for indigestion or heartburn.    Marland Kitchen artificial tears (LACRILUBE) OINT ophthalmic ointment Place into both eyes every 4 (four) hours as needed for dry eyes. 3.5 g 0  . aspirin 81  MG chewable tablet Chew 81 mg by mouth daily.    . benztropine (COGENTIN) 1 MG tablet Take 1 mg by mouth 2 (two) times daily.    . bisacodyl (DULCOLAX) 5 MG EC tablet Take 10 mg by mouth daily as needed for moderate constipation.    . busPIRone (BUSPAR) 15 MG tablet Take 15 mg by mouth 2 (two) times daily.    . cholecalciferol (VITAMIN D) 1000 units tablet Take 1,000 Units by mouth daily.    Marland Kitchen donepezil  (ARICEPT) 10 MG tablet Take 10 mg by mouth at bedtime.    Marland Kitchen guaifenesin (ROBITUSSIN) 100 MG/5ML syrup Take 200 mg by mouth 4 (four) times daily as needed for cough.    . lithium carbonate 300 MG capsule Take 600 mg by mouth at bedtime.    Marland Kitchen loperamide (IMODIUM) 2 MG capsule Take 2 mg by mouth daily as needed for diarrhea or loose stools.    . magnesium hydroxide (MILK OF MAGNESIA) 400 MG/5ML suspension Take 30 mLs by mouth daily as needed for mild constipation.    . Melatonin 3 MG TABS Take 1 tablet by mouth daily.    . metFORMIN (GLUCOPHAGE) 500 MG tablet Take 500 mg by mouth 2 (two) times daily with a meal.    . metoprolol succinate (TOPROL-XL) 25 MG 24 hr tablet Take 25 mg by mouth 2 (two) times daily.    . Multiple Vitamin (MULTIVITAMIN WITH MINERALS) TABS tablet Take 1 tablet by mouth daily.    . pravastatin (PRAVACHOL) 20 MG tablet Take 20 mg by mouth daily.    . predniSONE (DELTASONE) 20 MG tablet Take 2 tablets (40 mg total) by mouth daily. 10 tablet 0  . risperiDONE (RISPERDAL) 3 MG tablet Take 3 mg by mouth at bedtime.    . sodium chloride (OCEAN) 0.65 % SOLN nasal spray Place 1 spray into both nostrils 2 (two) times daily. 30 mL 0  . traZODone (DESYREL) 50 MG tablet Take 50 mg by mouth at bedtime as needed for sleep.    . valACYclovir (VALTREX) 1000 MG tablet Take 1 tablet (1,000 mg total) by mouth 3 (three) times daily. 21 tablet 0   No current facility-administered medications for this visit.    REVIEW OF SYSTEMS:     All other systems were reviewed with the patient and are negative.  PHYSICAL EXAMINATION: ECOG PERFORMANCE STATUS: 1 - Symptomatic but completely ambulatory  Vitals:   05/10/21 1324  BP: 124/82  Pulse: 60  Resp: 17  Temp: 98.4 F (36.9 C)  SpO2: 99%   Filed Weights   05/10/21 1324  Weight: 224 lb 1.6 oz (101.7 kg)     LABORATORY DATA:  I have reviewed the data as listed Lab Results  Component Value Date   WBC 8.4 05/10/2021   HGB 12.1  05/10/2021   HCT 38.5 05/10/2021   MCV 89.7 05/10/2021   PLT 360 05/10/2021   Lab Results  Component Value Date   NA 140 05/10/2021   K 4.3 05/10/2021   CL 106 05/10/2021   CO2 24 05/10/2021    RADIOGRAPHIC STUDIES: I have personally reviewed the radiological reports and agreed with the findings in the report.  ASSESSMENT AND PLAN:  Ductal carcinoma in situ (DCIS) of right breast 04/26/2021:Screening mammogram detected bilateral breast distortion and asymmetry, left breast mass: 9 o'clock position: 0.5 cm, axilla negative: Biopsy revealed intermediate grade DCIS and a complex sclerosing lesion, ER 95%, PR 80%  Pathology review: I discussed with the  patient the difference between DCIS and invasive breast cancer. It is considered a precancerous lesion. DCIS is classified as a 0. It is generally detected through mammograms as calcifications. We discussed the significance of grades and its impact on prognosis. We also discussed the importance of ER and PR receptors and their implications to adjuvant treatment options. Prognosis of DCIS dependence on grade, comedo necrosis. It is anticipated that if not treated, 20-30% of DCIS can develop into invasive breast cancer.  Recommendation: 1. Breast conserving surgery versus active surveillance  2. probably not a good candidate for adjuvant radiation therapy 3. Followed by antiestrogen therapy with tamoxifen 5 years  Tamoxifen counseling: We discussed the risks and benefits of tamoxifen. These include but not limited to insomnia, hot flashes, mood changes, vaginal dryness, and weight gain. Although rare, serious side effects including endometrial cancer, risk of blood clots were also discussed. We strongly believe that the benefits far outweigh the risks. Patient understands these risks and consented to starting treatment. Planned treatment duration is 5 years.  Patient has mild dementia and 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  Her family is very involved in her  care. Return to clinic after surgery to discuss the final pathology report and come up with an adjuvant treatment plan.     All questions were answered. The patient knows to call the clinic with any problems, questions or concerns.    Harriette Ohara, MD 05/10/21

## 2021-05-10 NOTE — Assessment & Plan Note (Signed)
04/26/2021:Screening mammogram detected bilateral breast distortion and asymmetry, left breast mass: 9 o'clock position: 0.5 cm, axilla negative: Biopsy revealed intermediate grade DCIS and a complex sclerosing lesion, ER 95%, PR 80%  Pathology review: I discussed with the patient the difference between DCIS and invasive breast cancer. It is considered a precancerous lesion. DCIS is classified as a 0. It is generally detected through mammograms as calcifications. We discussed the significance of grades and its impact on prognosis. We also discussed the importance of ER and PR receptors and their implications to adjuvant treatment options. Prognosis of DCIS dependence on grade, comedo necrosis. It is anticipated that if not treated, 20-30% of DCIS can develop into invasive breast cancer.  Recommendation: 1. Breast conserving surgery 2. Followed by adjuvant radiation therapy 3. Followed by antiestrogen therapy with tamoxifen 5 years  Tamoxifen counseling: We discussed the risks and benefits of tamoxifen. These include but not limited to insomnia, hot flashes, mood changes, vaginal dryness, and weight gain. Although rare, serious side effects including endometrial cancer, risk of blood clots were also discussed. We strongly believe that the benefits far outweigh the risks. Patient understands these risks and consented to starting treatment. Planned treatment duration is 5 years.  Patient has mild dementia and 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  Her family is very involved in her care. Return to clinic after surgery to discuss the final pathology report and come up with an adjuvant treatment plan.

## 2021-05-10 NOTE — Progress Notes (Signed)
Radiation Oncology         [phone]) [phone] ________________________________  Initial Outpatient Consultation  Name: Lindsey Garcia MRN: [id number]  Date: 05/10/2021  DOB: [birth year]  TO:IZTIW, Mikeal Hawthorne, MD  Stark Klein, MD   REFERRING PHYSICIAN: Stark Klein, MD  DIAGNOSIS:    ICD-10-CM   1. 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 female who presented with bilateral breast abnormalities on the following imaging: bilateral screening mammogram on the date of 03/20/2021. No symptoms were reported at that time. Ultrasound of bilateral breasts on 04/13/2021 revealed a distortion/mass associated with calcifications in the 9 o'clock location of the right breast that was suspicious for malignancy. There were also noted to be benign fibrocystic changes in the lower portion of the left breast. There was no right axillary adenopathy. Biopsy of the right breast on the date of 04/26/2021 showed DCIS arising in a complex sclerosing lesion. ER status: 90% strong; PR status: 80% strong; Grade: 2.  She is here with her daughter today.  She has met with medical oncology and both of them feel comfortable with her taking an antiestrogen pill.  She is still considering whether she will undergo breast conserving surgery before taking the pill or just take the pill with close surveillance.  PREVIOUS RADIATION THERAPY: No  PAST MEDICAL HISTORY:  has a past medical history of Amnestic disorder, Dementia ([place]), and Hypertension.    PAST SURGICAL HISTORY:History reviewed. No pertinent surgical history.  FAMILY HISTORY: family history is not on file.  SOCIAL HISTORY:  reports that she has been smoking. She has never used smokeless tobacco. She reports that she does not drink alcohol.  ALLERGIES:  Patient has no known allergies.  MEDICATIONS:  Current Outpatient Medications  Medication Sig Dispense Refill  . acetaminophen (TYLENOL) 500 MG tablet Take 500 mg by mouth every 6 (six) hours as needed for moderate pain.    Marland Kitchen alum & mag hydroxide-simeth (MINTOX) [phone] MG/5ML suspension Take 30 mLs by mouth every 6 (six) hours as needed for indigestion or heartburn.    Marland Kitchen artificial tears (LACRILUBE) OINT ophthalmic ointment Place into both eyes every 4 (four) hours as needed for dry eyes. 3.5 g 0  . aspirin 81 MG chewable tablet Chew 81 mg by mouth daily.    . benztropine (COGENTIN) 1 MG tablet Take 1 mg by mouth 2 (two) times daily.    . bisacodyl (DULCOLAX) 5 MG EC tablet Take 10 mg by mouth daily as needed for moderate constipation.    . busPIRone (BUSPAR) 15 MG tablet Take 15 mg by mouth 2 (two) times daily.    . cholecalciferol (VITAMIN D) 1000 units tablet Take 1,000 Units by mouth daily.    Marland Kitchen donepezil (ARICEPT) 10 MG tablet Take 10 mg by mouth at bedtime.    Marland Kitchen guaifenesin (ROBITUSSIN) 100 MG/5ML syrup Take 200 mg by mouth 4 (four) times daily as needed for cough.    . lithium carbonate 300 MG capsule Take 600 mg by mouth at bedtime.    Marland Kitchen loperamide (IMODIUM) 2 MG capsule Take 2 mg by mouth daily as needed for diarrhea or loose stools.    . magnesium hydroxide (MILK OF MAGNESIA) 400 MG/5ML suspension Take 30 mLs by mouth daily as needed for mild constipation.    Marland Kitchen  Melatonin 3 MG TABS Take 1 tablet by mouth daily.    . metFORMIN (GLUCOPHAGE) 500 MG tablet Take 500 mg by mouth 2 (two) times daily with a meal.    . metoprolol succinate (TOPROL-XL) 25 MG 24 hr tablet Take 25 mg by mouth 2 (two) times daily.    . Multiple Vitamin (MULTIVITAMIN WITH MINERALS) TABS tablet Take 1 tablet by mouth daily.    . pravastatin (PRAVACHOL) 20 MG tablet Take 20 mg by mouth daily.    . predniSONE (DELTASONE) 20 MG tablet Take 2 tablets (40 mg total) by mouth daily. 10 tablet 0  . risperiDONE  (RISPERDAL) 3 MG tablet Take 3 mg by mouth at bedtime.    . sodium chloride (OCEAN) 0.65 % SOLN nasal spray Place 1 spray into both nostrils 2 (two) times daily. 30 mL 0  . traZODone (DESYREL) 50 MG tablet Take 50 mg by mouth at bedtime as needed for sleep.    . valACYclovir (VALTREX) 1000 MG tablet Take 1 tablet (1,000 mg total) by mouth 3 (three) times daily. 21 tablet 0   No current facility-administered medications for this encounter.    REVIEW OF SYSTEMS: As Above  PHYSICAL EXAM:  vitals were not taken for this visit.   General: in no acute distress; conversant Skin: Dry peeling and erythema of skin over face Neurologic: Resting tremor of upper extremities bilaterally  Psychiatric: Evidence of cognitive impairment  Breasts: No palpable masses appreciated in the breasts or axillae bilaterally.   ECOG = 3  0 - Asymptomatic (Fully active, able to carry on all predisease activities without restriction)  1 - Symptomatic but completely ambulatory (Restricted in physically strenuous activity but ambulatory and able to carry out work of a light or sedentary nature. For example, light housework, office work)  2 - Symptomatic, <50% in bed during the day (Ambulatory and capable of all self care but unable to carry out any work activities. Up and about more than 50% of waking hours)  3 - Symptomatic, >50% in bed, but not bedbound (Capable of only limited self-care, confined to bed or chair 50% or more of waking hours)  4 - Bedbound (Completely disabled. Cannot carry on any self-care. Totally confined to bed or chair)  5 - Death   Eustace Pen MM, Creech RH, Tormey DC, et al. [phone]). "Toxicity and response criteria of the [facility]". [place] Oncol. 5 (6): 649-55   LABORATORY DATA:  Lab Results  Component Value Date   WBC 8.4 05/10/2021   HGB 12.1 05/10/2021   HCT 38.5 05/10/2021   MCV 89.7 05/10/2021   PLT 360 05/10/2021   CMP     Component Value Date/Time    NA 140 05/10/2021 1303   K 4.3 05/10/2021 1303   CL 106 05/10/2021 1303   CO2 24 05/10/2021 1303   GLUCOSE 157 (H) 05/10/2021 1303   BUN 11 05/10/2021 1303   CREATININE 0.85 05/10/2021 1303   CALCIUM 9.8 05/10/2021 1303   PROT 7.6 05/10/2021 1303   ALBUMIN 3.5 05/10/2021 1303   AST 12 (L) 05/10/2021 1303   ALT 9 05/10/2021 1303   ALKPHOS 85 05/10/2021 1303   BILITOT <0.2 (L) 05/10/2021 1303   GFRNONAA >60 05/10/2021 1303   GFRAA >60 01/15/2019 1037         RADIOGRAPHY: US BREAST LTD UNI LEFT INC AXILLA  Result Date: 04/13/2021 CLINICAL DATA:  The patient returns after baseline screening for evaluation of possible RIGHT breast asymmetry  and possible LEFT breast mass. EXAM: DIGITAL DIAGNOSTIC BILATERAL MAMMOGRAM WITH TOMOSYNTHESIS AND CAD; ULTRASOUND LEFT BREAST LIMITED; ULTRASOUND RIGHT BREAST LIMITED TECHNIQUE: Bilateral digital diagnostic mammography and breast tomosynthesis was performed. The images were evaluated with computer-aided detection.; Targeted ultrasound examination of the left breast was performed; Targeted ultrasound examination of the right breast was performed COMPARISON:  03/20/2021 ACR Breast Density Category b: There are scattered areas of fibroglandular density. FINDINGS: RIGHT BREAST: Mammogram: Additional 2-D and 3-D images are performed. These views show focal distortion in the LATERAL mid aspect of the RIGHT breast, middle depth. Distortion is associated with faint microcalcifications. Mammographic images were processed with CAD. Ultrasound: Targeted ultrasound is performed, showing subtle hypoechoic mass associated with posterior acoustic shadowing in the 9 o'clock location of the RIGHT breast 7 centimeters from the nipple. There is no internal blood flow. Lesion is estimated to measure 0.4 x 0.5 x 0.5 centimeters sonographically. Evaluation the RIGHT axilla is negative for adenopathy. LEFT BREAST: Mammogram: Additional 2-D and 3-D images are performed. These views  confirm presence of a circumscribed oval mass in the LOWER central LEFT breast. Mammographic images were processed with CAD. Ultrasound: Targeted ultrasound is performed, showing a circumscribed group of anechoic cysts in the 6 o'clock location of the LEFT breast 3 centimeters from the nipple which measures 0.5 x 0.3 x 0.5 centimeters. There is no associated solid component or internal blood flow. IMPRESSION: 1. Distortion/mass associated with calcifications in the 9 o'clock location of the RIGHT breast suspicious for malignancy. 2. No RIGHT axillary adenopathy. 3. Benign fibrocystic changes in the LOWER portion of the LEFT breast. RECOMMENDATION: Ultrasound-guided core biopsy of mass in the 9 o'clock location of the RIGHT breast. I have discussed the findings and recommendations with the patient. If applicable, a reminder letter will be sent to the patient regarding the next appointment. BI-RADS CATEGORY  4: Suspicious. Electronically Signed   By: Nolon Nations M.D.   On: 04/13/2021 13:46   US BREAST LTD UNI RIGHT INC AXILLA  Result Date: 04/13/2021 CLINICAL DATA:  The patient returns after baseline screening for evaluation of possible RIGHT breast asymmetry and possible LEFT breast mass. EXAM: DIGITAL DIAGNOSTIC BILATERAL MAMMOGRAM WITH TOMOSYNTHESIS AND CAD; ULTRASOUND LEFT BREAST LIMITED; ULTRASOUND RIGHT BREAST LIMITED TECHNIQUE: Bilateral digital diagnostic mammography and breast tomosynthesis was performed. The images were evaluated with computer-aided detection.; Targeted ultrasound examination of the left breast was performed; Targeted ultrasound examination of the right breast was performed COMPARISON:  03/20/2021 ACR Breast Density Category b: There are scattered areas of fibroglandular density. FINDINGS: RIGHT BREAST: Mammogram: Additional 2-D and 3-D images are performed. These views show focal distortion in the LATERAL mid aspect of the RIGHT breast, middle depth. Distortion is associated with  faint microcalcifications. Mammographic images were processed with CAD. Ultrasound: Targeted ultrasound is performed, showing subtle hypoechoic mass associated with posterior acoustic shadowing in the 9 o'clock location of the RIGHT breast 7 centimeters from the nipple. There is no internal blood flow. Lesion is estimated to measure 0.4 x 0.5 x 0.5 centimeters sonographically. Evaluation the RIGHT axilla is negative for adenopathy. LEFT BREAST: Mammogram: Additional 2-D and 3-D images are performed. These views confirm presence of a circumscribed oval mass in the LOWER central LEFT breast. Mammographic images were processed with CAD. Ultrasound: Targeted ultrasound is performed, showing a circumscribed group of anechoic cysts in the 6 o'clock location of the LEFT breast 3 centimeters from the nipple which measures 0.5 x 0.3 x 0.5 centimeters. There is no associated solid  component or internal blood flow. IMPRESSION: 1. Distortion/mass associated with calcifications in the 9 o'clock location of the RIGHT breast suspicious for malignancy. 2. No RIGHT axillary adenopathy. 3. Benign fibrocystic changes in the LOWER portion of the LEFT breast. RECOMMENDATION: Ultrasound-guided core biopsy of mass in the 9 o'clock location of the RIGHT breast. I have discussed the findings and recommendations with the patient. If applicable, a reminder letter will be sent to the patient regarding the next appointment. BI-RADS CATEGORY  4: Suspicious. Electronically Signed   By: Nolon Nations M.D.   On: 04/13/2021 13:46   MM DIAG BREAST TOMO BILATERAL  Result Date: 04/13/2021 CLINICAL DATA:  The patient returns after baseline screening for evaluation of possible RIGHT breast asymmetry and possible LEFT breast mass. EXAM: DIGITAL DIAGNOSTIC BILATERAL MAMMOGRAM WITH TOMOSYNTHESIS AND CAD; ULTRASOUND LEFT BREAST LIMITED; ULTRASOUND RIGHT BREAST LIMITED TECHNIQUE: Bilateral digital diagnostic mammography and breast tomosynthesis was  performed. The images were evaluated with computer-aided detection.; Targeted ultrasound examination of the left breast was performed; Targeted ultrasound examination of the right breast was performed COMPARISON:  03/20/2021 ACR Breast Density Category b: There are scattered areas of fibroglandular density. FINDINGS: RIGHT BREAST: Mammogram: Additional 2-D and 3-D images are performed. These views show focal distortion in the LATERAL mid aspect of the RIGHT breast, middle depth. Distortion is associated with faint microcalcifications. Mammographic images were processed with CAD. Ultrasound: Targeted ultrasound is performed, showing subtle hypoechoic mass associated with posterior acoustic shadowing in the 9 o'clock location of the RIGHT breast 7 centimeters from the nipple. There is no internal blood flow. Lesion is estimated to measure 0.4 x 0.5 x 0.5 centimeters sonographically. Evaluation the RIGHT axilla is negative for adenopathy. LEFT BREAST: Mammogram: Additional 2-D and 3-D images are performed. These views confirm presence of a circumscribed oval mass in the LOWER central LEFT breast. Mammographic images were processed with CAD. Ultrasound: Targeted ultrasound is performed, showing a circumscribed group of anechoic cysts in the 6 o'clock location of the LEFT breast 3 centimeters from the nipple which measures 0.5 x 0.3 x 0.5 centimeters. There is no associated solid component or internal blood flow. IMPRESSION: 1. Distortion/mass associated with calcifications in the 9 o'clock location of the RIGHT breast suspicious for malignancy. 2. No RIGHT axillary adenopathy. 3. Benign fibrocystic changes in the LOWER portion of the LEFT breast. RECOMMENDATION: Ultrasound-guided core biopsy of mass in the 9 o'clock location of the RIGHT breast. I have discussed the findings and recommendations with the patient. If applicable, a reminder letter will be sent to the patient regarding the next appointment. BI-RADS CATEGORY   4: Suspicious. Electronically Signed   By: Nolon Nations M.D.   On: 04/13/2021 13:46   MM CLIP PLACEMENT RIGHT  Result Date: 04/26/2021 CLINICAL DATA:  Status post ultrasound-guided core biopsy of RIGHT breast. EXAM: DIAGNOSTIC RIGHT MAMMOGRAM POST ULTRASOUND BIOPSY COMPARISON:  Previous exam(s). FINDINGS: Mammographic images were obtained following ultrasound guided biopsy of mass in the 9 o'clock location of the RIGHT breast and placement of a ribbon shaped clip. The biopsy marking clip is along the posterior aspect of the distortion in the UPPER-OUTER QUADRANT of the RIGHT breast. IMPRESSION: Appropriate positioning of the ribbon shaped biopsy marking clip at the site of biopsy in the distortion in the UPPER-OUTER QUADRANT of the RIGHT breast. Final Assessment: Post Procedure Mammograms for Marker Placement Electronically Signed   By: Nolon Nations M.D.   On: 04/26/2021 14:43   [place] RT BREAST BX W LOC DEV 1ST LESION  IMG BX SPEC US GUIDE  Addendum Date: 04/28/2021   ADDENDUM REPORT: 04/28/2021 14:38 ADDENDUM: Pathology revealed INTERMEDIATE GRADE DUCTAL CARCINOMA IN SITU ARISING IN A COMPLEX SCLEROSING LESION of the RIGHT breast, 9 o'clock, distortion. This was found to be concordant by Dr. Nolon Nations. Pathology results were discussed with the patient's daughter [facility]) and son Army [place]) by telephone with Dr. Owens Shark. Results and recommendations called to [facility]) of [facility]. There was not a nurse available. The patient reported doing well after the biopsy with tenderness at the site. Post biopsy instructions and care were reviewed and questions were answered. The facility was encouraged to call [facility] for any additional concerns. After discussion with the patient's family and care team, the patient was referred to [facility] on May 10, 2021. Pathology results reported by Stacie Acres RN on 04/28/2021. Electronically Signed   By: Nolon Nations M.D.   On: 04/28/2021 14:38   Result Date: 04/28/2021 CLINICAL DATA:  Patient presents for ultrasound-guided core biopsy of the RIGHT breast. EXAM: ULTRASOUND GUIDED RIGHT BREAST CORE NEEDLE BIOPSY COMPARISON:  Previous exam(s). PROCEDURE: I met with the patient and we discussed the procedure of ultrasound-guided biopsy, including benefits and alternatives. We discussed the high likelihood of a successful procedure. We discussed the risks of the procedure, including infection, bleeding, tissue injury, clip migration, and inadequate sampling. Informed written consent was given. The usual time-out protocol was performed immediately prior to the procedure. Lesion quadrant: RIGHT breast 9 o'clock Using sterile technique and 1% Lidocaine as local anesthetic, under direct ultrasound visualization, a 12 gauge coaxial spring-loaded device was used to perform biopsy of mass in the 9 o'clock location of the RIGHT breast using a inferior to superior approach. At the conclusion of the procedure ribbon shaped tissue marker clip was deployed into the biopsy cavity. Follow up 2 view mammogram was performed and dictated separately. IMPRESSION: Ultrasound guided biopsy of RIGHT breast mass. No apparent complications. Electronically Signed: By: Nolon Nations M.D. On: 04/26/2021 14:26      IMPRESSION/PLAN: Right breast DCIS  I have personally reviewed her images.  The consensus of her multidisciplinary team is that she is a candidate for breast conserving surgery if she so chooses with her family.  This would  be followed by adjuvant antiestrogen therapy. However, a less aggressive option is for her to take an antiestrogen pill and be monitored closely with imaging -given her dementia we believe this is a reasonable alternative.  She does not qualify for the COMET trial due to her dementia.  Patient and her daughter  are deciding whether they want to go through surgery.  She is comfortable taking the pill  Regarding adjuvant radiation therapy, I do not think that this would significantly improve her prognosis and the risks may outweigh the potential benefits given her other cognitive and medical issues.  She has a resting tremor which is not insurmountable, but would make immobilization for treatment more challenging.  When I started to describe the logistics of radiation therapy the patient was quite adamant that she would not want to go through that.  I will see her back on an as-needed basis.  I wished the patient the very best as she makes her decision of how she will proceed forward with surgical oncology and medical oncology.  On date of service, in total, I spent 35 minutes on this encounter.  Patient was seen in person.   __________________________________________   Eppie Gibson, MD  This document serves as a record of services personally performed by Eppie Gibson, MD. It was created on his behalf by Clerance Lav, a trained medical scribe. The creation of this record is based on the scribe's personal observations and the provider's statements to them. This document has been checked and approved by the attending provider.

## 2021-05-11 ENCOUNTER — Encounter: Payer: Self-pay | Admitting: *Deleted

## 2021-05-12 ENCOUNTER — Encounter: Payer: Self-pay | Admitting: Radiation Oncology

## 2021-05-18 ENCOUNTER — Telehealth: Payer: Self-pay | Admitting: *Deleted

## 2021-05-18 ENCOUNTER — Encounter: Payer: Self-pay | Admitting: *Deleted

## 2021-05-18 NOTE — Telephone Encounter (Signed)
Spoke with patient's daughter Clevette and she informed me that they have decided to pursue sx and the anti-estrogen.  Informed her that someone from Dr. Marlowe Aschoff office will call her with appt date and time and instructions. She verbalized understanding.  She denies any other questions or concerns at this time.

## 2021-05-24 ENCOUNTER — Encounter: Payer: Self-pay | Admitting: *Deleted

## 2021-05-24 ENCOUNTER — Other Ambulatory Visit: Payer: Self-pay | Admitting: General Surgery

## 2021-05-24 DIAGNOSIS — D0511 Intraductal carcinoma in situ of right breast: Secondary | ICD-10-CM

## 2021-05-24 DIAGNOSIS — Z170 Estrogen receptor positive status [ER+]: Secondary | ICD-10-CM

## 2021-05-24 DIAGNOSIS — C50411 Malignant neoplasm of upper-outer quadrant of right female breast: Secondary | ICD-10-CM

## 2021-05-25 ENCOUNTER — Telehealth: Payer: Self-pay | Admitting: Hematology and Oncology

## 2021-05-25 ENCOUNTER — Telehealth: Payer: Self-pay | Admitting: Radiation Oncology

## 2021-05-25 ENCOUNTER — Encounter: Payer: Self-pay | Admitting: *Deleted

## 2021-05-25 NOTE — Telephone Encounter (Signed)
Scheduled appointment per 06/01 sch message. Left message with Dede at the front desk at the [facility].

## 2021-05-25 NOTE — Telephone Encounter (Signed)
Called pt daughter, Clevette, and was told that patient is opting out of radiation. I have closed referral and sent a msg to Bary Castilla, Therapist, sports.

## 2021-05-31 ENCOUNTER — Telehealth: Payer: Self-pay | Admitting: Hematology and Oncology

## 2021-05-31 NOTE — Telephone Encounter (Signed)
Scheduled appointment per 06/07. Patient is aware.

## 2021-06-09 ENCOUNTER — Encounter: Payer: Self-pay | Admitting: *Deleted

## 2021-06-14 ENCOUNTER — Ambulatory Visit: Payer: Medicare Other | Admitting: Hematology and Oncology

## 2021-06-20 ENCOUNTER — Other Ambulatory Visit: Payer: Self-pay

## 2021-06-20 ENCOUNTER — Ambulatory Visit
Admission: RE | Admit: 2021-06-20 | Discharge: 2021-06-20 | Disposition: A | Discharge status: Home / Self Care | Payer: Medicare Other | Source: Ambulatory Visit | Attending: General Surgery | Admitting: General Surgery

## 2021-06-20 ENCOUNTER — Encounter (HOSPITAL_COMMUNITY): Payer: Self-pay | Admitting: General Surgery

## 2021-06-20 DIAGNOSIS — Z170 Estrogen receptor positive status [ER+]: Secondary | ICD-10-CM

## 2021-06-20 NOTE — Progress Notes (Addendum)
Surgical Instructions    Your procedure is scheduled on June 21, 2021.  Report to [facility] Main Entrance "A" at 06:30 A.M., then check in with the Admitting office.  Call this number if you have problems the morning of surgery:  [phone]   If you have any questions prior to your surgery date call [phone]: Open Monday-Friday 8am-4pm   Remember:  Do not eat after midnight the night before your surgery  You may drink clear liquids until 06:00am the morning of your surgery.   Clear liquids allowed are: Water, Non-Citrus Juices (without pulp), Carbonated Beverages, Clear Tea, Black Coffee Only, and Gatorade    Take these medicines the morning of surgery with A SIP OF WATER : Benztropine (Cogentin) Buspirone (Buspar) Metoprolol Succinate (Toprol XL)  IF NEEDED: Acetaminophen (Tylenol)  As of today, STOP taking any Aspirin (unless otherwise instructed by your surgeon) Aleve, Naproxen, Ibuprofen, Motrin, Advil, Goody's, BC's, all herbal medications, fish oil, and all vitamins.  WHAT DO I DO ABOUT MY DIABETES MEDICATION?  Do not take oral diabetes medicines (pills) the morning of surgery.  DO NOT TAKE METFORMIN (GLUCOPHAGE) THE MORNING OF SURGERY.  HOW TO MANAGE YOUR DIABETES BEFORE AND AFTER SURGERY  Why is it important to control my blood sugar before and after surgery? Improving blood sugar levels before and after surgery helps healing and can limit problems. A way of improving blood sugar control is eating a healthy diet by:  Eating less sugar and carbohydrates  Increasing activity/exercise  Talking with your doctor about reaching your blood sugar goals High blood sugars (greater than 180 mg/dL) can raise your risk of infections and slow your recovery, so you will need to focus on controlling your diabetes during the weeks before surgery. Make sure that the doctor who takes care of your diabetes knows about your planned surgery including the date and location.  How do  I manage my blood sugar before surgery? Check your blood sugar at least 4 times a day, starting 2 days before surgery, to make sure that the level is not too high or low.  Check your blood sugar the morning of your surgery when you wake up and every 2 hours until you get to the Short Stay unit.  If your blood sugar is less than 70 mg/dL, you will need to treat for low blood sugar: Do not take insulin. Treat a low blood sugar (less than 70 mg/dL) with  cup of clear juice (cranberry or apple), 4 glucose tablets, OR glucose gel. Recheck blood sugar in 15 minutes after treatment (to make sure it is greater than 70 mg/dL). If your blood sugar is not greater than 70 mg/dL on recheck, call [phone] for further instructions. Report your blood sugar to the short stay nurse when you get to Short Stay.  If you are admitted to the hospital after surgery: Your blood sugar will be checked by the staff and you will probably be given insulin after surgery (instead of oral diabetes medicines) to make sure you have good blood sugar levels. The goal for blood sugar control after surgery is 80-180 mg/dL.                    Do not wear jewelry or makeup. Do not wear lotions, powders, perfumes/colognes, or deodorant. Do not shave 48 hours prior to surgery.   Do not bring valuables to the hospital. Do not wear nail polish, gel polish, artificial nails, or any other type of covering  on natural nails including finger and toenails. If patients have artificial nails, gel coating, etc. that need to be removed by a nail salon, please have this removed prior to surgery.  Surgery may need to be canceled/delayed if the surgeon/ anesthesia feels like the patient is unable to be adequately monitored due to artificial nails, gel coating, etc.   [facility] is not responsible for any belongings or valuables.  Do NOT Smoke (Tobacco/Vaping) or drink Alcohol 24 hours prior to your procedure If you use a CPAP at night, you  may bring all equipment for your overnight stay.   Contacts, glasses, dentures or bridgework may not be worn into surgery, please bring cases for these belongings   For patients admitted to the hospital, discharge time will be determined by your treatment team.   Patients discharged the day of surgery will not be allowed to drive home, and someone needs to stay with them for 24 hours.

## 2021-06-20 NOTE — Progress Notes (Signed)
PCP - Dtr unable to remember Cardiologist - Denies EKG - needs DOS Chest x-ray - n/a ECHO - denies Cardiac Cath - denies CPAP - denies  Fasting Blood Sugar:  unknown Checks Blood Sugar:  Checked at Facility  Blood Thinner Instructions: n/a  ERAS Protcol - clears until 6am  COVID TEST- n/a--ambulatory surgery  Anesthesia review: no  Pt is a resident at [facility] care unit.  History obtained from [place], [employer].   Systems developer at [employer] regarding instructions for [employer].  Crystal requested medication instructions be signed by MD/advanced provider.  Willeen Cass NP reviewed allowed medications and signed per facility requested.  Faxed instructions to [phone]

## 2021-06-20 NOTE — H&P (Signed)
Lindsey Garcia Location: [facility] Patient #: [id number] DOB: 18-Jul-1956 Undefined / Language: Lindsey Garcia / Race: Refused to Report/Unreported Female   History of Present Illness The patient is a 65 year old female who presents with breast cancer. Pt is a 65 yo F who is diagnosed with right breast cancer 04/2021. She had screening detected distortion.  Diagnostic imaging showed a 5 mm region of calcifications.  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  She also had a suspicious finding in the left breast on screening mammogram, but this resolved into fibrocystic change on dx mammogram and u/s.    Of note, the patient has dementia of unclear cause and is in a facility.  Her daughter accompanies her and is her power of attorney.    dx mammo/us 04/13/21  ACR Breast Density Category b: There are scattered areas of fibroglandular density.  FINDINGS: RIGHT BREAST:  Mammogram: Additional 2-D and 3-D images are performed. These views show focal distortion in the LATERAL mid aspect of the RIGHT breast, middle depth. Distortion is associated with faint microcalcifications. Mammographic images were processed with CAD.  Ultrasound: Targeted ultrasound is performed, showing subtle hypoechoic mass associated with posterior acoustic shadowing in the 9 o'clock location of the RIGHT breast 7 centimeters from the nipple. There is no internal blood flow. Lesion is estimated to measure 0.4 x 0.5 x 0.5 centimeters sonographically.  Evaluation the RIGHT axilla is negative for adenopathy.  LEFT BREAST:  Mammogram: Additional 2-D and 3-D images are performed. These views confirm presence of a circumscribed oval mass in the LOWER central LEFT breast. Mammographic images were processed with CAD.  Ultrasound: Targeted ultrasound is performed, showing a circumscribed group of anechoic cysts in the 6 o'clock location of the LEFT breast 3 centimeters from the  nipple which measures 0.5 x 0.3 x 0.5 centimeters. There is no associated solid component or internal blood flow.  IMPRESSION: 1. Distortion/mass associated with calcifications in the 9 o'clock location of the RIGHT breast suspicious for malignancy. 2. No RIGHT axillary adenopathy. 3. Benign fibrocystic changes in the LOWER portion of the LEFT breast.  RECOMMENDATION: Ultrasound-guided core biopsy of mass in the 9 o'clock location of the RIGHT breast.  I have discussed the findings and recommendations with the patient. If applicable, a reminder letter will be sent to the patient regarding the next appointment.  BI-RADS CATEGORY  4: Suspicious.   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 Estrogen Receptor: 90%, POSITIVE, STRONG STAINING INTENSITY Progesterone Receptor: 80%, POSITIVE, STRONG STAINING INTENSITY  CBC and CMET essentially normal 5/18 other than glucose 157.     Past Surgical History  Tubal Ligation    Medication History Medications Reconciled     Review of Systems All other systems negative  Note:  The patient is unable to give a full accounting of her symptoms due to dementia.   Vitals Weight: 224.1 lb   Height: 65 in  Body Surface Area: 2.08 m   Body Mass Index: 37.29 kg/m   Temp.: 98.4 F    Pulse: 60 (Regular)    Resp.: 17 (Unlabored)    BP: 124/82(Sitting, Left Arm, Standard)       Physical Exam  General Mental Status - Alert. General Appearance - Consistent with stated age. Hydration -  Well hydrated. Voice - Normal.  Head and Neck Head - normocephalic, atraumatic with no lesions or palpable masses. Trachea - midline. Thyroid Gland Characteristics - normal size and  consistency.  Eye Eyeball - Bilateral - Extraocular movements intact. Sclera/Conjunctiva - Bilateral - No scleral icterus.  Chest and Lung Exam Chest and lung exam reveals  - quiet, even and easy respiratory effort with no use of accessory muscles and on auscultation, normal breath sounds, no adventitious sounds and normal vocal resonance. Inspection Chest Wall - Normal. Back - normal.  Breast Note:  Ptotic breasts bilaterally. Some bruising laterally on right breast wtih hematoma palpable. No palpable mass otherwise. no nipple retraction or skin dimpling. No nipple discharge. no LAD. left breast benign.   Cardiovascular Cardiovascular examination reveals  - normal heart sounds, regular rate and rhythm with no murmurs and normal pedal pulses bilaterally.  Abdomen Inspection Inspection of the abdomen reveals - No Hernias. Palpation/Percussion Palpation and Percussion of the abdomen reveal - Soft, Non Tender, No Rebound tenderness, No Rigidity (guarding) and No hepatosplenomegaly. Auscultation Auscultation of the abdomen reveals - Bowel sounds normal.  Neurologic Neurologic evaluation reveals  - alert and oriented x 3 with no impairment of recent or remote memory. Mental Status - Normal. Note:  pronounced tremor at rest.   Musculoskeletal Global Assessment  - Note:  no gross deformities.  Normal Exam - Left - Upper Extremity Strength Normal and Lower Extremity Strength Normal. Normal Exam - Right - Upper Extremity Strength Normal and Lower Extremity Strength Normal.  Lymphatic Head & Neck  General Head & Neck Lymphatics: Bilateral - Description - Normal. Axillary  General Axillary Region: Bilateral - Description - Normal. Tenderness - Non Tender. Femoral & Inguinal  Generalized Femoral & Inguinal Lymphatics: Bilateral - Description - No Generalized lymphadenopathy.    Assessment & Plan  MALIGNANT NEOPLASM OF UPPER-OUTER QUADRANT OF RIGHT BREAST IN FEMALE, ESTROGEN  RECEPTOR POSITIVE (C50.411) Impression: Pt has a new diagnosis of a stage 0 breast cancer. Pt would be a candidate for COMET if she did not have dementia. We discussed with the patient and daughter the options which would be  1. Standard of care with seed localized lumpectomy, XRT, and antihormone treatment. 2. Lumpectomy and antihormone tx 3. Lumpectomy and radiation 4. Antihormone treatment alone 5. observation.  The pros and cons of these approaches were discussed. Given the patient's dementia, we don't want our treatment to be too much of a burden on her quality of life. I reviewed risks of anesthesia with dementia, risk of NOT doing surgery in that we don't have adequate data to say that that is "safe."  The patient and daughter are going to discuss.  55 min spent in evaluation, examination, counseling, and coordination of care. >50% spent in counseling. Current Plans Pt Education - flb breast cancer surgery: discussed with patient and provided information.

## 2021-06-21 ENCOUNTER — Ambulatory Visit (HOSPITAL_COMMUNITY): Payer: Medicare Other | Admitting: Anesthesiology

## 2021-06-21 ENCOUNTER — Encounter (HOSPITAL_COMMUNITY)
Admission: RE | Disposition: A | Discharge status: Home / Self Care | Payer: Self-pay | Source: Home / Self Care | Attending: General Surgery

## 2021-06-21 ENCOUNTER — Ambulatory Visit (HOSPITAL_COMMUNITY)
Admission: RE | Admit: 2021-06-21 | Discharge: 2021-06-21 | Disposition: A | Discharge status: Home / Self Care | Payer: Medicare Other | Attending: General Surgery | Admitting: General Surgery

## 2021-06-21 ENCOUNTER — Encounter (HOSPITAL_COMMUNITY): Payer: Self-pay | Admitting: General Surgery

## 2021-06-21 ENCOUNTER — Ambulatory Visit
Admission: RE | Admit: 2021-06-21 | Discharge: 2021-06-21 | Disposition: A | Discharge status: Home / Self Care | Payer: Medicare Other | Source: Ambulatory Visit | Attending: General Surgery | Admitting: General Surgery

## 2021-06-21 DIAGNOSIS — Z170 Estrogen receptor positive status [ER+]: Secondary | ICD-10-CM | POA: Insufficient documentation

## 2021-06-21 DIAGNOSIS — E119 Type 2 diabetes mellitus without complications: Secondary | ICD-10-CM | POA: Diagnosis not present

## 2021-06-21 DIAGNOSIS — C50411 Malignant neoplasm of upper-outer quadrant of right female breast: Secondary | ICD-10-CM | POA: Diagnosis not present

## 2021-06-21 DIAGNOSIS — F0390 Unspecified dementia without behavioral disturbance: Secondary | ICD-10-CM | POA: Insufficient documentation

## 2021-06-21 HISTORY — PX: BREAST LUMPECTOMY WITH RADIOACTIVE SEED LOCALIZATION: SHX6424

## 2021-06-21 HISTORY — DX: Type 2 diabetes mellitus without complications: E11.9

## 2021-06-21 HISTORY — DX: Alcohol abuse, in remission: F10.11

## 2021-06-21 LAB — CBC
HCT: 38.5 % (ref 36.0–46.0)
Hemoglobin: 12 g/dL (ref 12.0–15.0)
MCH: 28.8 pg (ref 26.0–34.0)
MCHC: 31.2 g/dL (ref 30.0–36.0)
MCV: 92.3 fL (ref 80.0–100.0)
Platelets: 321 10*3/uL (ref 150–400)
RBC: 4.17 MIL/uL (ref 3.87–5.11)
RDW: 12.9 % (ref 11.5–15.5)
WBC: 7.3 10*3/uL (ref 4.0–10.5)
nRBC: 0 % (ref 0.0–0.2)

## 2021-06-21 LAB — COMPREHENSIVE METABOLIC PANEL
ALT: 13 U/L (ref 0–44)
AST: 15 U/L (ref 15–41)
Albumin: 3.2 g/dL — ABNORMAL LOW (ref 3.5–5.0)
Alkaline Phosphatase: 67 U/L (ref 38–126)
Anion gap: 7 (ref 5–15)
BUN: 7 mg/dL — ABNORMAL LOW (ref 8–23)
CO2: 22 mmol/L (ref 22–32)
Calcium: 9.2 mg/dL (ref 8.9–10.3)
Chloride: 108 mmol/L (ref 98–111)
Creatinine, Ser: 0.69 mg/dL (ref 0.44–1.00)
GFR, Estimated: >60 mL/min (ref >60)
Glucose, Bld: 101 mg/dL — ABNORMAL HIGH (ref 70–99)
Potassium: 3.9 mmol/L (ref 3.5–5.1)
Sodium: 137 mmol/L (ref 135–145)
Total Bilirubin: 0.4 mg/dL (ref 0.3–1.2)
Total Protein: 6.7 g/dL (ref 6.5–8.1)

## 2021-06-21 LAB — GLUCOSE, CAPILLARY
Glucose-Capillary: 106 mg/dL — ABNORMAL HIGH (ref 70–99)
Glucose-Capillary: 102 mg/dL — ABNORMAL HIGH (ref 70–99)

## 2021-06-21 SURGERY — BREAST LUMPECTOMY WITH RADIOACTIVE SEED LOCALIZATION
Anesthesia: General | Site: Breast | Laterality: Right

## 2021-06-21 MED ORDER — FENTANYL CITRATE (PF) 100 MCG/2ML IJ SOLN
INTRAMUSCULAR | Status: DC | PRN
  Administered 2021-06-21 (×5): 50 ug via INTRAVENOUS

## 2021-06-21 MED ORDER — CHLORHEXIDINE GLUCONATE CLOTH 2 % EX PADS: 6.0000 | MEDICATED_PAD | Freq: Once | CUTANEOUS | Status: DC

## 2021-06-21 MED ORDER — ONDANSETRON HCL 4 MG/2ML IJ SOLN: 4.0000 mg | Freq: Four times a day (QID) | INTRAMUSCULAR | Status: DC | PRN

## 2021-06-21 MED ORDER — OXYCODONE HCL 5 MG PO TABS
5.0000 mg | ORAL_TABLET | Freq: Once | ORAL | Status: AC | PRN
  Administered 2021-06-21: 5 mg via ORAL

## 2021-06-21 MED ORDER — ONDANSETRON HCL 4 MG/2ML IJ SOLN
INTRAMUSCULAR | Status: DC | PRN
  Administered 2021-06-21: 4 mg via INTRAVENOUS

## 2021-06-21 MED ORDER — LACTATED RINGERS IV SOLN: INTRAVENOUS | Status: DC

## 2021-06-21 MED ORDER — LIDOCAINE HCL 1 % IJ SOLN
INTRAMUSCULAR | Status: DC | PRN
  Administered 2021-06-21: 40 mL

## 2021-06-21 MED ORDER — ORAL CARE MOUTH RINSE: 15.0000 mL | Freq: Once | OROMUCOSAL | Status: AC

## 2021-06-21 MED ORDER — OXYCODONE HCL 5 MG PO TABS
ORAL_TABLET | ORAL | Status: AC
  Filled 2021-06-21: qty 1

## 2021-06-21 MED ORDER — LIDOCAINE HCL (CARDIAC) PF 100 MG/5ML IV SOSY
PREFILLED_SYRINGE | INTRAVENOUS | Status: DC | PRN
  Administered 2021-06-21: 50 mg via INTRAVENOUS

## 2021-06-21 MED ORDER — OXYCODONE HCL 5 MG/5ML PO SOLN: 5.0000 mg | Freq: Once | ORAL | Status: AC | PRN

## 2021-06-21 MED ORDER — FENTANYL CITRATE (PF) 100 MCG/2ML IJ SOLN: 25.0000 ug | INTRAMUSCULAR | Status: DC | PRN

## 2021-06-21 MED ORDER — LIDOCAINE HCL 1 % IJ SOLN
INTRAMUSCULAR | Status: AC
  Filled 2021-06-21: qty 20

## 2021-06-21 MED ORDER — BUPIVACAINE-EPINEPHRINE (PF) 0.25% -1:200000 IJ SOLN
INTRAMUSCULAR | Status: AC
  Filled 2021-06-21: qty 30

## 2021-06-21 MED ORDER — CHLORHEXIDINE GLUCONATE 0.12 % MT SOLN
15.0000 mL | Freq: Once | OROMUCOSAL | Status: AC
  Administered 2021-06-21: 15 mL via OROMUCOSAL
  Filled 2021-06-21: qty 15

## 2021-06-21 MED ORDER — FENTANYL CITRATE (PF) 250 MCG/5ML IJ SOLN
INTRAMUSCULAR | Status: AC
  Filled 2021-06-21: qty 5

## 2021-06-21 MED ORDER — ACETAMINOPHEN 500 MG PO TABS
1000.0000 mg | ORAL_TABLET | ORAL | Status: AC
  Administered 2021-06-21: 1000 mg via ORAL
  Filled 2021-06-21: qty 2

## 2021-06-21 MED ORDER — ONDANSETRON HCL 4 MG/2ML IJ SOLN
INTRAMUSCULAR | Status: AC
  Filled 2021-06-21: qty 2

## 2021-06-21 MED ORDER — PROPOFOL 10 MG/ML IV BOLUS
INTRAVENOUS | Status: DC | PRN
  Administered 2021-06-21: 120 mg via INTRAVENOUS

## 2021-06-21 MED ORDER — CEFAZOLIN SODIUM-DEXTROSE 2-4 GM/100ML-% IV SOLN
2.0000 g | INTRAVENOUS | Status: AC
  Administered 2021-06-21: 2 g via INTRAVENOUS
  Filled 2021-06-21: qty 100

## 2021-06-21 MED ORDER — TRAMADOL HCL 50 MG PO TABS: 50.0000 mg | ORAL_TABLET | Freq: Three times a day (TID) | ORAL | 0 refills | Status: AC | PRN

## 2021-06-21 MED ORDER — 0.9 % SODIUM CHLORIDE (POUR BTL) OPTIME
TOPICAL | Status: DC | PRN
  Administered 2021-06-21: 1000 mL

## 2021-06-21 MED ORDER — PROPOFOL 10 MG/ML IV BOLUS
INTRAVENOUS | Status: AC
  Filled 2021-06-21: qty 40

## 2021-06-21 MED ORDER — LIDOCAINE 2% (20 MG/ML) 5 ML SYRINGE
INTRAMUSCULAR | Status: AC
  Filled 2021-06-21: qty 5

## 2021-06-21 SURGICAL SUPPLY — 42 items
BAG COUNTER SPONGE SURGICOUNT (BAG) ×2 IMPLANT
BINDER BREAST LRG (GAUZE/BANDAGES/DRESSINGS) IMPLANT
BINDER BREAST XLRG (GAUZE/BANDAGES/DRESSINGS) IMPLANT
BINDER BREAST XXLRG (GAUZE/BANDAGES/DRESSINGS) ×2 IMPLANT
BLADE SURG 10 STRL SS (BLADE) ×2 IMPLANT
CANISTER SUCT 3000ML PPV (MISCELLANEOUS) IMPLANT
CHLORAPREP W/TINT 26 (MISCELLANEOUS) ×2 IMPLANT
CLIP VESOCCLUDE LG 6/CT (CLIP) ×2 IMPLANT
CLIP VESOCCLUDE MED 6/CT (CLIP) ×2 IMPLANT
COVER PROBE W GEL 5X96 (DRAPES) ×2 IMPLANT
COVER SURGICAL LIGHT HANDLE (MISCELLANEOUS) ×2 IMPLANT
DERMABOND ADVANCED (GAUZE/BANDAGES/DRESSINGS) ×1
DERMABOND ADVANCED .7 DNX12 (GAUZE/BANDAGES/DRESSINGS) ×1 IMPLANT
DEVICE DUBIN SPECIMEN MAMMOGRA (MISCELLANEOUS) ×2 IMPLANT
DRAPE CHEST BREAST 15X10 FENES (DRAPES) ×2 IMPLANT
DRSG PAD ABDOMINAL 8X10 ST (GAUZE/BANDAGES/DRESSINGS) ×2 IMPLANT
ELECT COATED BLADE 2.86 ST (ELECTRODE) ×2 IMPLANT
ELECT REM PT RETURN 9FT ADLT (ELECTROSURGICAL) ×2
ELECTRODE REM PT RTRN 9FT ADLT (ELECTROSURGICAL) ×1 IMPLANT
GAUZE SPONGE 4X4 12PLY STRL LF (GAUZE/BANDAGES/DRESSINGS) ×2 IMPLANT
GLOVE SURG ENC MOIS LTX SZ6 (GLOVE) ×2 IMPLANT
GLOVE SURG UNDER LTX SZ6.5 (GLOVE) ×2 IMPLANT
GOWN STRL REUS W/ TWL LRG LVL3 (GOWN DISPOSABLE) ×1 IMPLANT
GOWN STRL REUS W/TWL 2XL LVL3 (GOWN DISPOSABLE) ×2 IMPLANT
GOWN STRL REUS W/TWL LRG LVL3 (GOWN DISPOSABLE) ×1
KIT BASIN OR (CUSTOM PROCEDURE TRAY) ×2 IMPLANT
KIT MARKER MARGIN INK (KITS) ×2 IMPLANT
LIGHT WAVEGUIDE WIDE FLAT (MISCELLANEOUS) IMPLANT
NEEDLE HYPO 25GX1X1/2 BEV (NEEDLE) ×2 IMPLANT
NS IRRIG 1000ML POUR BTL (IV SOLUTION) IMPLANT
PACK GENERAL/GYN (CUSTOM PROCEDURE TRAY) ×2 IMPLANT
PAD ABD 8X10 STRL (GAUZE/BANDAGES/DRESSINGS) ×2 IMPLANT
STRIP CLOSURE SKIN 1/2X4 (GAUZE/BANDAGES/DRESSINGS) ×2 IMPLANT
SUT MNCRL AB 4-0 PS2 18 (SUTURE) ×2 IMPLANT
SUT SILK 2 0 SH (SUTURE) IMPLANT
SUT VIC AB 2-0 SH 27 (SUTURE) ×1
SUT VIC AB 2-0 SH 27XBRD (SUTURE) ×1 IMPLANT
SUT VIC AB 3-0 SH 27 (SUTURE) ×1
SUT VIC AB 3-0 SH 27X BRD (SUTURE) ×1 IMPLANT
SYR CONTROL 10ML LL (SYRINGE) ×2 IMPLANT
TOWEL GREEN STERILE (TOWEL DISPOSABLE) ×2 IMPLANT
TOWEL GREEN STERILE FF (TOWEL DISPOSABLE) ×2 IMPLANT

## 2021-06-21 NOTE — Interval H&P Note (Signed)
History and Physical Interval Note:  06/21/2021 9:16 AM  Lindsey Garcia  has presented today for surgery, with the diagnosis of RIGHT BREAST CANCER.  The various methods of treatment have been discussed with the patient and family. After consideration of risks, benefits and other options for treatment, the patient has consented to  Procedure(s): RIGHT BREAST LUMPECTOMY WITH RADIOACTIVE SEED LOCALIZATION (Right) as a surgical intervention.  The patient's history has been reviewed, patient examined, no change in status, stable for surgery.  I have reviewed the patient's chart and labs.  Questions were answered to the patient's satisfaction.     Stark Klein

## 2021-06-21 NOTE — Anesthesia Procedure Notes (Signed)
Procedure Name: LMA Insertion Date/Time: 06/21/2021 9:38 AM Performed by: Jonna Munro, CRNA Pre-anesthesia Checklist: Patient identified, Emergency Drugs available, Suction available, Patient being monitored and Timeout performed Patient Re-evaluated:Patient Re-evaluated prior to induction Oxygen Delivery Method: Circle system utilized Preoxygenation: Pre-oxygenation with 100% oxygen Induction Type: IV induction LMA: LMA with gastric port inserted LMA Size: 4.0 Number of attempts: 1 Placement Confirmation: positive ETCO2 and breath sounds checked- equal and bilateral Tube secured with: Tape Dental Injury: Teeth and Oropharynx as per pre-operative assessment

## 2021-06-21 NOTE — Discharge Instructions (Addendum)
[facility] Office Phone Number [phone]  BREAST BIOPSY/ PARTIAL MASTECTOMY: POST OP INSTRUCTIONS  Always review your discharge instruction sheet given to you by the facility where your surgery was performed.  IF YOU HAVE DISABILITY OR FAMILY LEAVE FORMS, YOU MUST BRING THEM TO THE OFFICE FOR PROCESSING.  DO NOT GIVE THEM TO YOUR DOCTOR.  A prescription for pain medication may be given to you upon discharge.  Take your pain medication as prescribed, if needed.  If narcotic pain medicine is not needed, then you may take acetaminophen (Tylenol) or ibuprofen (Advil) as needed. Take your usually prescribed medications unless otherwise directed If you need a refill on your pain medication, please contact your pharmacy.  They will contact our office to request authorization.  Prescriptions will not be filled after 5pm or on week-ends. You should eat very light the first 24 hours after surgery, such as soup, crackers, pudding, etc.  Resume your normal diet the day after surgery. Most patients will experience some swelling and bruising in the breast.  Ice packs and a good support bra will help.  Swelling and bruising can take several days to resolve.  It is common to experience some constipation if taking pain medication after surgery.  Increasing fluid intake and taking a stool softener will usually help or prevent this problem from occurring.  A mild laxative (Milk of Magnesia or Miralax) should be taken according to package directions if there are no bowel movements after 48 hours. Unless discharge instructions indicate otherwise, you may remove your bandages 48 hours after surgery, and you may shower at that time.  You may have steri-strips (small skin tapes) in place directly over the incision.  These strips should be left on the skin for 7-10 days.   Any sutures or staples will be removed at the office during your follow-up visit. ACTIVITIES:  You may resume regular daily activities  (gradually increasing) beginning the next day.  Wearing a good support bra or sports bra (or the breast binder) minimizes pain and swelling.  You may have sexual intercourse when it is comfortable. You may drive when you no longer are taking prescription pain medication, you can comfortably wear a seatbelt, and you can safely maneuver your car and apply brakes. RETURN TO WORK:  __________1 week_______________ You should see your doctor in the office for a follow-up appointment approximately two weeks after your surgery.  Your doctor's nurse will typically make your follow-up appointment when she calls you with your pathology report.  Expect your pathology report 2-3 business days after your surgery.  You may call to check if you do not hear from us after three days.   WHEN TO CALL YOUR DOCTOR: Fever over 101.0 Nausea and/or vomiting. Extreme swelling or bruising. Continued bleeding from incision. Increased pain, redness, or drainage from the incision.  The clinic staff is available to answer your questions during regular business hours.  Please don't hesitate to call and ask to speak to one of the nurses for clinical concerns.  If you have a medical emergency, go to the nearest emergency room or call 911.  A surgeon from [facility] is always on call at the hospital.  For further questions, please visit [email or web address]   

## 2021-06-21 NOTE — Transfer of Care (Signed)
Immediate Anesthesia Transfer of Care Note  Patient: Lindsey Garcia  Procedure(s) Performed: RIGHT BREAST LUMPECTOMY WITH RADIOACTIVE SEED LOCALIZATION (Right: Breast)  Patient Location: PACU  Anesthesia Type:General  Level of Consciousness: awake, alert  and patient cooperative  Airway & Oxygen Therapy: Patient Spontanous Breathing  Post-op Assessment: Report given to RN, Post -op Vital signs reviewed and stable and Patient moving all extremities X 4  Post vital signs: Reviewed and stable  Last Vitals:  Vitals Value Taken Time  BP    Temp    Pulse 123 06/21/21 1115  Resp 16 06/21/21 1115  SpO2 94 % 06/21/21 1115  Vitals shown include unvalidated device data.  Last Pain:  Vitals:   06/21/21 0722  TempSrc:   PainSc: 0-No pain         Complications: No notable events documented.

## 2021-06-21 NOTE — Op Note (Signed)
Right Breast Radioactive seed localized lumpectomy  Indications: This patient presents with history of right breast cancer, upper outer quadrant, intermediate grade cTis, receptors +/+  Pre-operative Diagnosis: right breast cancer  Post-operative Diagnosis: Same  Surgeon: Stark Klein   Anesthesia: General endotracheal anesthesia  ASA Class: 3  Procedure Details  The patient was seen in the Holding Room. The risks, benefits, complications, treatment options, and expected outcomes were discussed with the patient. The possibilities of bleeding, infection, the need for additional procedures, failure to diagnose a condition, and creating a complication requiring other procedures or operations were discussed with the patient. The patient concurred with the proposed plan, giving informed consent.  The site of surgery properly noted/marked. The patient was taken to Operating Room # 9, identified, and the procedure verified as right breast seed localized lumpectomy.  The right breast and chest were prepped and draped in standard fashion. A lateral incision was made near the previously placed radioactive seed.  Dissection was carried down around the point of maximum signal intensity. The cautery was used to perform the dissection.   The specimen was inked with the margin marker paint kit.    Specimen radiography confirmed inclusion of the mammographic lesion, the clip, and the seed.  It was close to the margin, so additional margins were taken (see below). The background signal in the breast was zero.  Hemostasis was achieved with cautery.  The cavity was marked with clips on each border other than the anterior border.  Local anesthetic was administered around the cavity. The wound was irrigated and closed with 3-0 vicryl interrupted deep dermal sutures and 4-0 monocryl running subcuticular suture.      Sterile dressings were applied. At the end of the operation, all sponge, instrument, and needle counts  were correct.   Findings: Seed, clip in specimen.  anterior margin is skin, posterior margin is pectoralis.   Estimated Blood Loss:  min         Specimens: right breast tissue with seed, additional medial margin, additional inferior margin, additional posterior margin, additional superior margin.           Complications:  None; patient tolerated the procedure well.         Disposition: PACU - hemodynamically stable.         Condition: stable

## 2021-06-21 NOTE — Anesthesia Preprocedure Evaluation (Signed)
Anesthesia Evaluation  Patient identified by MRN, date of birth, ID band Patient awake    Reviewed: Allergy & Precautions, H&P , NPO status , Patient's Chart, lab work & pertinent test results  Airway Mallampati: II   Neck ROM: full    Dental   Pulmonary Current Smoker,    breath sounds clear to auscultation       Cardiovascular hypertension,  Rhythm:regular Rate:Normal     Neuro/Psych    GI/Hepatic (+)     substance abuse  alcohol use,   Endo/Other  diabetes, Type 2  Renal/GU      Musculoskeletal   Abdominal   Peds  Hematology   Anesthesia Other Findings   Reproductive/Obstetrics                             Anesthesia Physical Anesthesia Plan  ASA: 3  Anesthesia Plan: General   Post-op Pain Management:    Induction: Intravenous  PONV Risk Score and Plan: 2 and Ondansetron, Dexamethasone, Midazolam and Treatment may vary due to age or medical condition  Airway Management Planned: LMA  Additional Equipment:   Intra-op Plan:   Post-operative Plan: Extubation in OR  Informed Consent: I have reviewed the patients History and Physical, chart, labs and discussed the procedure including the risks, benefits and alternatives for the proposed anesthesia with the patient or authorized representative who has indicated his/her understanding and acceptance.     Dental advisory given  Plan Discussed with: CRNA, Anesthesiologist and Surgeon  Anesthesia Plan Comments:         Anesthesia Quick Evaluation

## 2021-06-22 ENCOUNTER — Encounter (HOSPITAL_COMMUNITY): Payer: Self-pay | Admitting: General Surgery

## 2021-06-22 NOTE — Anesthesia Postprocedure Evaluation (Signed)
Anesthesia Post Note  Patient: Lindsey Garcia  Procedure(s) Performed: RIGHT BREAST LUMPECTOMY WITH RADIOACTIVE SEED LOCALIZATION (Right: Breast)     Patient location during evaluation: PACU Anesthesia Type: General Level of consciousness: awake and alert Pain management: pain level controlled Vital Signs Assessment: post-procedure vital signs reviewed and stable Respiratory status: spontaneous breathing, nonlabored ventilation, respiratory function stable and patient connected to nasal cannula oxygen Cardiovascular status: blood pressure returned to baseline and stable Postop Assessment: no apparent nausea or vomiting Anesthetic complications: no   No notable events documented.  Last Vitals:  Vitals:   06/21/21 1130 06/21/21 1145  BP: (!) 147/72 (!) 148/62  Pulse: (!) 57 (!) 56  Resp: 17 20  Temp:  (!) 36.2 C  SpO2: 97% 96%    Last Pain:  Vitals:   06/21/21 1145  TempSrc:   PainSc: 0-No pain                 Krysteena Stalker S

## 2021-06-26 NOTE — Progress Notes (Signed)
Patient Care Team: Sande Brothers, MD as PCP - General (Internal Medicine) Mauro Kaufmann, RN as Oncology Nurse Navigator Rockwell Germany, RN as Oncology Nurse Navigator Stark Klein, MD as Consulting Physician (General Surgery) Nicholas Lose, MD as Consulting Physician (Hematology and Oncology) Eppie Gibson, MD as Attending Physician (Radiation Oncology)  DIAGNOSIS:    ICD-10-CM   1. Ductal carcinoma in situ (DCIS) of right breast  D05.11       SUMMARY OF ONCOLOGIC HISTORY: Oncology History  Ductal carcinoma in situ (DCIS) of right breast  04/26/2021 Initial Diagnosis   Screening mammogram detected bilateral breast distortion and asymmetry, left breast mass: 9 o'clock position: 0.5 cm, axilla negative: Biopsy revealed intermediate grade DCIS and a complex sclerosing lesion, ER 95%, PR 80%   05/10/2021 Cancer Staging   Staging form: Breast, AJCC 8th Edition - Clinical stage from 05/10/2021: Stage 0 (cTis (DCIS), cN0, cM0, G2, ER+, PR+, HER2: Not Assessed) - Signed by Nicholas Lose, MD on 05/10/2021  Stage prefix: Initial diagnosis  Histologic grading system: 3 grade system    06/21/2021 Surgery   Right lumpectomy: Intermediate grade DCIS 0.8 cm, CS 7, ductal hyperplasia, margins negative, ER 95%, PR 80%     CHIEF COMPLIANT: Follow-up for right breast cancer  INTERVAL HISTORY: Lindsey Garcia is a 65 y.o. with above-mentioned history of right breast DCIS. She underwent a right breast lumpectomy on 06/21/21 with Dr. Barry Dienes. She presents to the clinic today for follow-up and discuss further treatment.  She is very happy to tell me that her surgery went very well.  She does not have any pain or discomfort.  ALLERGIES:  has No Known Allergies.  MEDICATIONS:  Current Outpatient Medications  Medication Sig Dispense Refill   acetaminophen (TYLENOL) 500 MG tablet Take 500 mg by mouth every 4 (four) hours as needed for moderate pain.     alum & mag hydroxide-simeth (MAALOX/MYLANTA)  200-200-20 MG/5ML suspension Take 30 mLs by mouth 4 (four) times daily as needed for indigestion or heartburn.     benztropine (COGENTIN) 1 MG tablet Take 1 mg by mouth 2 (two) times daily.     busPIRone (BUSPAR) 15 MG tablet Take 15 mg by mouth 2 (two) times daily.     Colloidal Oatmeal 1 % CREA Apply 1 application topically daily.     donepezil (ARICEPT) 10 MG tablet Take 10 mg by mouth at bedtime.     guaifenesin (ROBITUSSIN) 100 MG/5ML syrup Take 200 mg by mouth every 6 (six) hours as needed for cough.     latanoprost (XALATAN) 0.005 % ophthalmic solution Place 1 drop into both eyes every evening.     lithium carbonate 150 MG capsule Take 150 mg by mouth daily.     lithium carbonate 300 MG capsule Take 600 mg by mouth at bedtime.     loperamide (IMODIUM) 2 MG capsule Take 2 mg by mouth daily as needed for diarrhea or loose stools.     losartan (COZAAR) 25 MG tablet Take 25 mg by mouth daily.     magnesium hydroxide (MILK OF MAGNESIA) 400 MG/5ML suspension Take 30 mLs by mouth at bedtime as needed for mild constipation.     Melatonin 3 MG TABS Take 3 mg by mouth at bedtime.     metFORMIN (GLUCOPHAGE) 500 MG tablet Take 500 mg by mouth daily.     metoprolol succinate (TOPROL-XL) 25 MG 24 hr tablet Take 25 mg by mouth 2 (two) times daily.     Neomycin-Bacitracin-Polymyxin (  TRIPLE ANTIBIOTIC) [phone] OINT Apply 1 application topically as needed (wound care).     pravastatin (PRAVACHOL) 20 MG tablet Take 20 mg by mouth at bedtime.     risperiDONE (RISPERDAL) 3 MG tablet Take 3 mg by mouth at bedtime.     traMADol (ULTRAM) 50 MG tablet Take 1 tablet (50 mg total) by mouth every 8 (eight) hours as needed for moderate pain or severe pain. 10 tablet 0   traZODone (DESYREL) 50 MG tablet Take 50 mg by mouth at bedtime as needed for sleep.     Vitamin D, Ergocalciferol, (DRISDOL) 1.25 MG (50000 UNIT) CAPS capsule Take 50,000 Units by mouth every Friday.     White Petrolatum-Mineral Oil (REFRESH  P.M. OP) Place 1 drop into both eyes every 4 (four) hours as needed (dry eyes).     No current facility-administered medications for this visit.    PHYSICAL EXAMINATION: ECOG PERFORMANCE STATUS: 1 - Symptomatic but completely ambulatory  Vitals:   06/27/21 1458 06/27/21 1459  BP: (!) 152/123 (!) 161/118  Pulse: 80   Resp: 19   Temp: 97.7 F (36.5 C)   SpO2: 100%    Filed Weights   06/27/21 1458  Weight: 224 lb 3.2 oz (101.7 kg)      LABORATORY DATA:  I have reviewed the data as listed CMP Latest Ref Rng & Units 06/21/2021 05/10/2021 01/15/2019  Glucose 70 - 99 mg/dL 101(H) 157(H) 114(H)  BUN 8 - 23 mg/dL 7(L) 11 5(L)  Creatinine 0.44 - 1.00 mg/dL 0.69 0.85 0.73  Sodium 135 - 145 mmol/L 137 140 140  Potassium 3.5 - 5.1 mmol/L 3.9 4.3 4.0  Chloride 98 - 111 mmol/L 108 106 107  CO2 22 - 32 mmol/L '22 24 24  ' Calcium 8.9 - 10.3 mg/dL 9.2 9.8 9.5  Total Protein 6.5 - 8.1 g/dL 6.7 7.6 7.2  Total Bilirubin 0.3 - 1.2 mg/dL 0.4 <0.2(L) 0.3  Alkaline Phos 38 - 126 U/L 67 85 68  AST 15 - 41 U/L 15 12(L) 25  ALT 0 - 44 U/L '13 9 27    ' Lab Results  Component Value Date   WBC 7.3 06/21/2021   HGB 12.0 06/21/2021   HCT 38.5 06/21/2021   MCV 92.3 06/21/2021   PLT 321 06/21/2021   NEUTROABS 5.2 05/10/2021    ASSESSMENT & PLAN:  Ductal carcinoma in situ (DCIS) of right breast 04/26/2021:Screening mammogram detected bilateral breast distortion and asymmetry, left breast mass: 9 o'clock position: 0.5 cm, axilla negative: Biopsy revealed intermediate grade DCIS and a complex sclerosing lesion, ER 95%, PR 80%  06/21/2021: Right lumpectomy:Right lumpectomy: Intermediate grade DCIS 0.8 cm, CS 7, ductal hyperplasia, margins negative, ER 95%, PR 80%  Patient has mild dementia and 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  Her family is very involved in her care.  Tamoxifen counseling: We discussed the risks and benefits of tamoxifen. These include but not limited to insomnia, hot flashes, mood changes, vaginal  dryness, and weight gain. Although rare, serious side effects including endometrial cancer, risk of blood clots were also discussed. We strongly believe that the benefits far outweigh the risks. Patient understands these risks and consented to starting treatment. Planned treatment duration is 5 years. We will treat her with 10 mg of tamoxifen daily.  Mainly to avoid any major adverse effects.  She uses a walker to get around.  She is very pleasant dementia.  Return to clinic in 3 months for survivorship care plan visit    No orders  of the defined types were placed in this encounter.  The patient has a good understanding of the overall plan. she agrees with it. she will call with any problems that may develop before the next visit here.  Total time spent: 20 mins including face to face time and time spent for planning, charting and coordination of care  Rulon Eisenmenger, MD, MPH 06/27/2021  I, Thana Ates, am acting as scribe for Dr. Nicholas Lose.  I have reviewed the above documentation for accuracy and completeness, and I agree with the above.

## 2021-06-27 ENCOUNTER — Other Ambulatory Visit: Payer: Self-pay

## 2021-06-27 ENCOUNTER — Inpatient Hospital Stay: Payer: Medicare Other | Attending: Hematology and Oncology | Admitting: Hematology and Oncology

## 2021-06-27 ENCOUNTER — Encounter: Payer: Self-pay | Admitting: *Deleted

## 2021-06-27 DIAGNOSIS — Z79899 Other long term (current) drug therapy: Secondary | ICD-10-CM | POA: Insufficient documentation

## 2021-06-27 DIAGNOSIS — F0390 Unspecified dementia without behavioral disturbance: Secondary | ICD-10-CM | POA: Diagnosis not present

## 2021-06-27 DIAGNOSIS — D0511 Intraductal carcinoma in situ of right breast: Secondary | ICD-10-CM | POA: Diagnosis present

## 2021-06-27 DIAGNOSIS — Z79810 Long term (current) use of selective estrogen receptor modulators (SERMs): Secondary | ICD-10-CM | POA: Insufficient documentation

## 2021-06-27 DIAGNOSIS — Z170 Estrogen receptor positive status [ER+]: Secondary | ICD-10-CM | POA: Insufficient documentation

## 2021-06-27 LAB — SURGICAL PATHOLOGY

## 2021-06-27 MED ORDER — TAMOXIFEN CITRATE 10 MG PO TABS: 10.0000 mg | ORAL_TABLET | Freq: Every day | ORAL | 3 refills | Status: AC

## 2021-06-27 NOTE — Assessment & Plan Note (Addendum)
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  Her family is very involved in her care.  Tamoxifen counseling: We discussed the risks and benefits of tamoxifen. These include but not limited to insomnia, hot flashes, mood changes, vaginal dryness, and weight gain. Although rare, serious side effects including endometrial cancer, risk of blood clots were also discussed. We strongly believe that the benefits far outweigh the risks. Patient understands these risks and consented to starting treatment. Planned treatment duration is 5 years.  Return to clinic in 3 months for survivorship care plan visit

## 2021-06-29 ENCOUNTER — Encounter: Payer: Self-pay | Admitting: *Deleted

## 2021-08-02 ENCOUNTER — Other Ambulatory Visit: Payer: Self-pay | Admitting: Adult Health

## 2021-08-02 DIAGNOSIS — D0511 Intraductal carcinoma in situ of right breast: Secondary | ICD-10-CM

## 2021-09-21 ENCOUNTER — Telehealth: Payer: Self-pay | Admitting: *Deleted

## 2021-09-28 ENCOUNTER — Inpatient Hospital Stay: Payer: Medicare Other | Attending: Hematology and Oncology | Admitting: Adult Health

## 2021-10-26 IMAGING — MG MM BREAST LOCALIZATION CLIP
4 series · 4 of 12 positions shown · non-contrast
Comparison: Previous exam(s).

CLINICAL DATA: Status post ultrasound-guided core biopsy of RIGHT
breast.

EXAM:
DIAGNOSTIC RIGHT MAMMOGRAM POST ULTRASOUND BIOPSY

[R ML synth-2D]
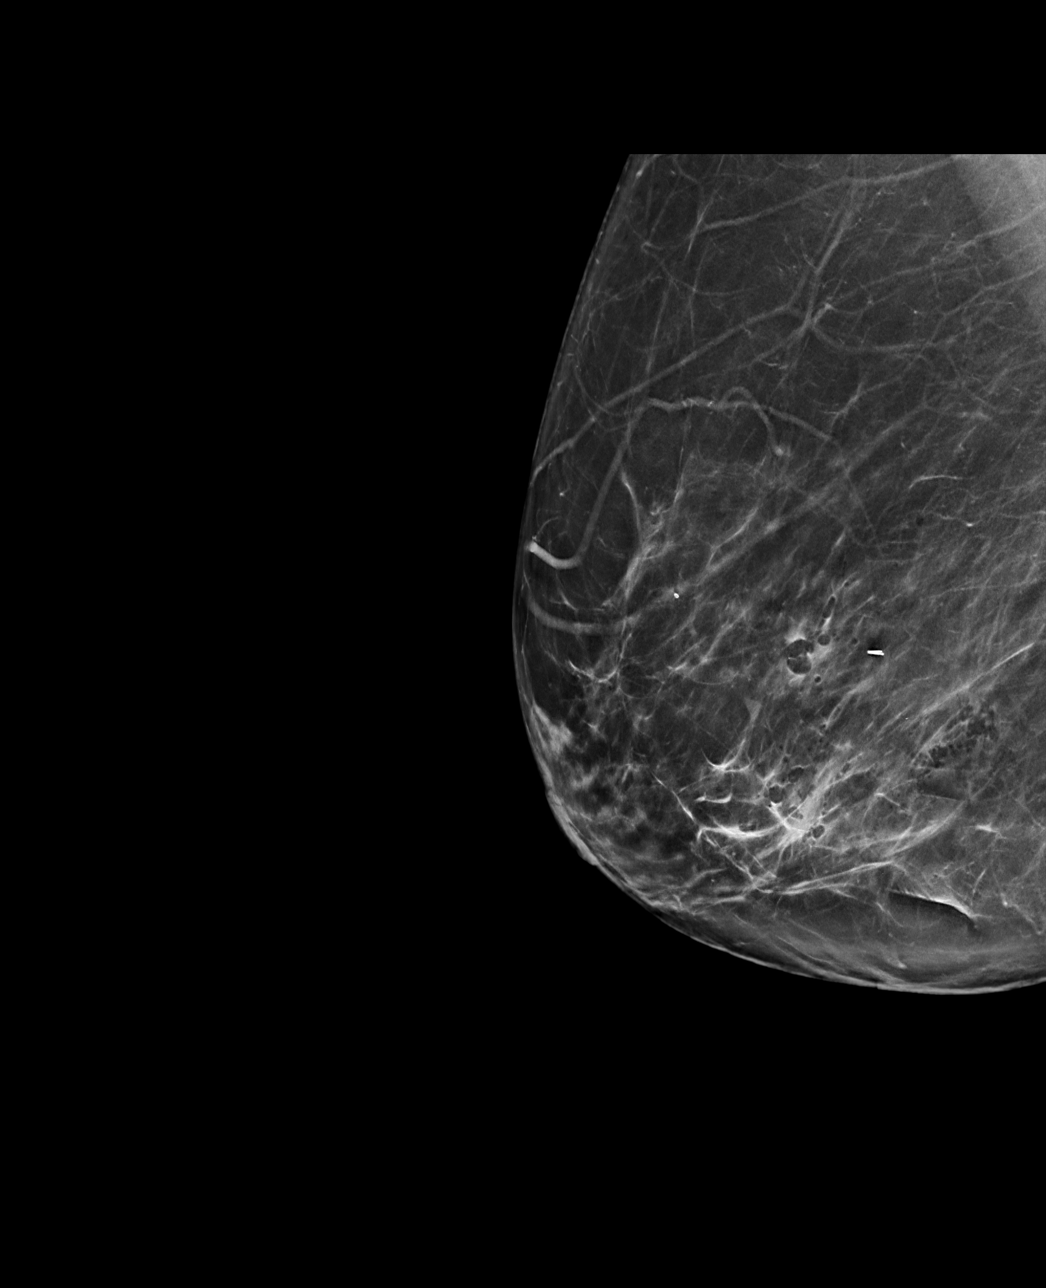

[R CC synth-2D]
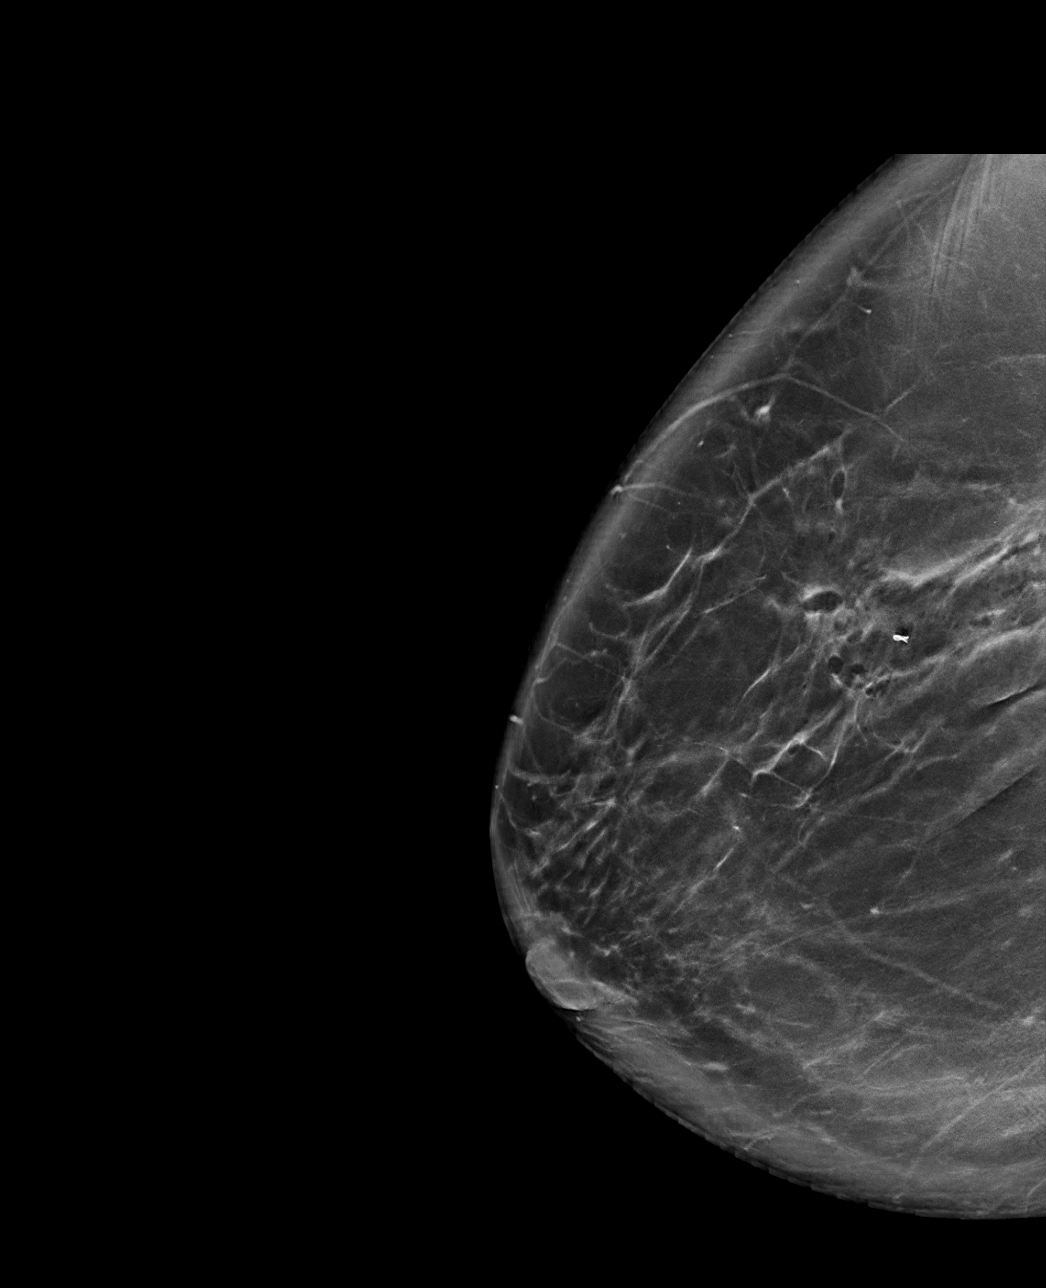

[R CC tomo · tomo slice 53/104.0]
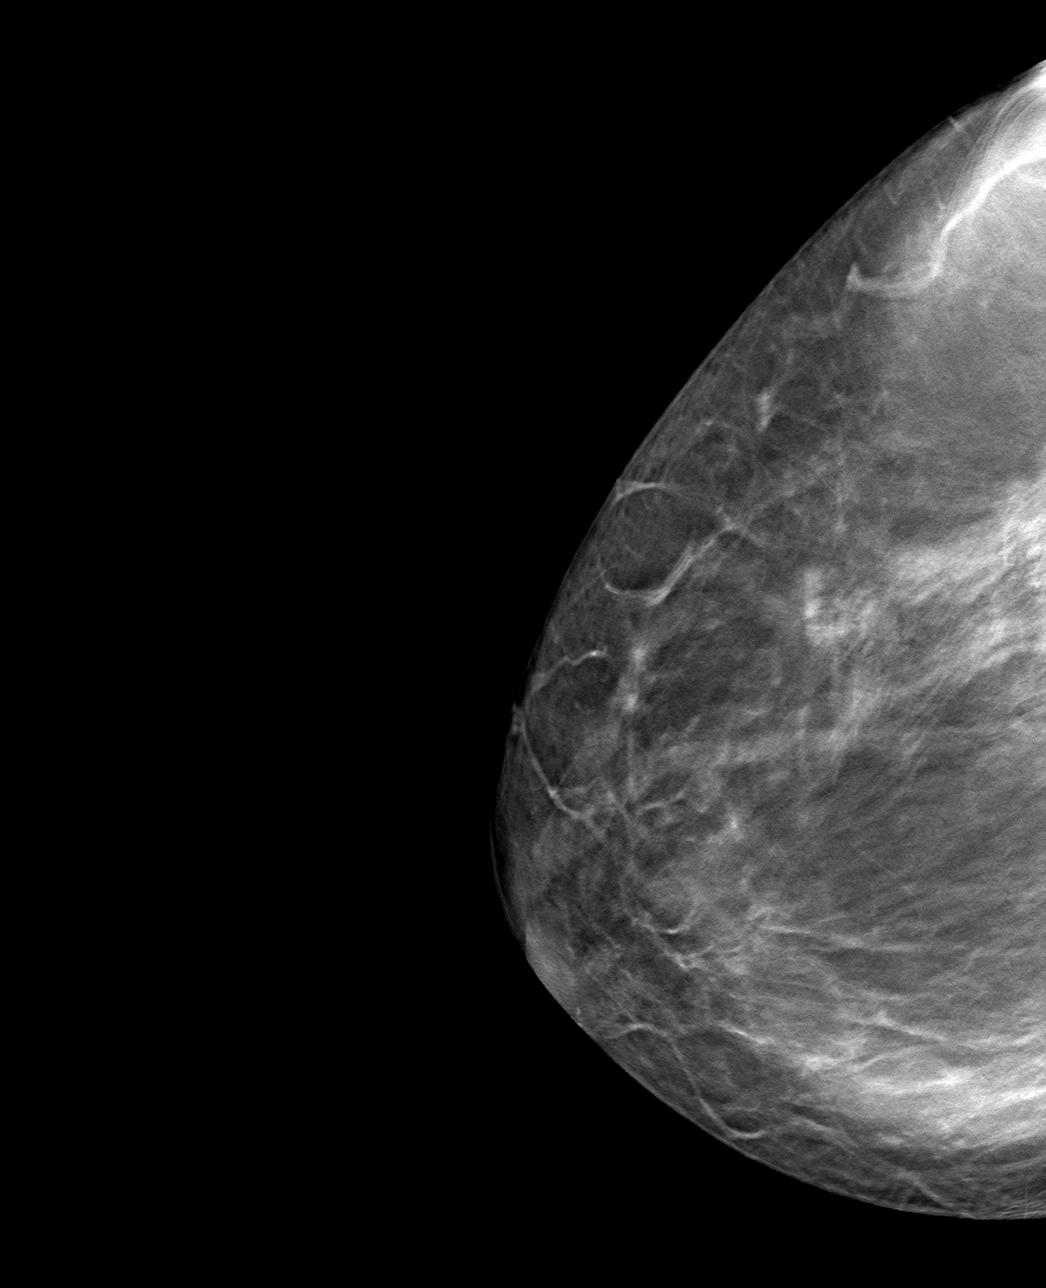

[R ML tomo · tomo slice 48/95.0]
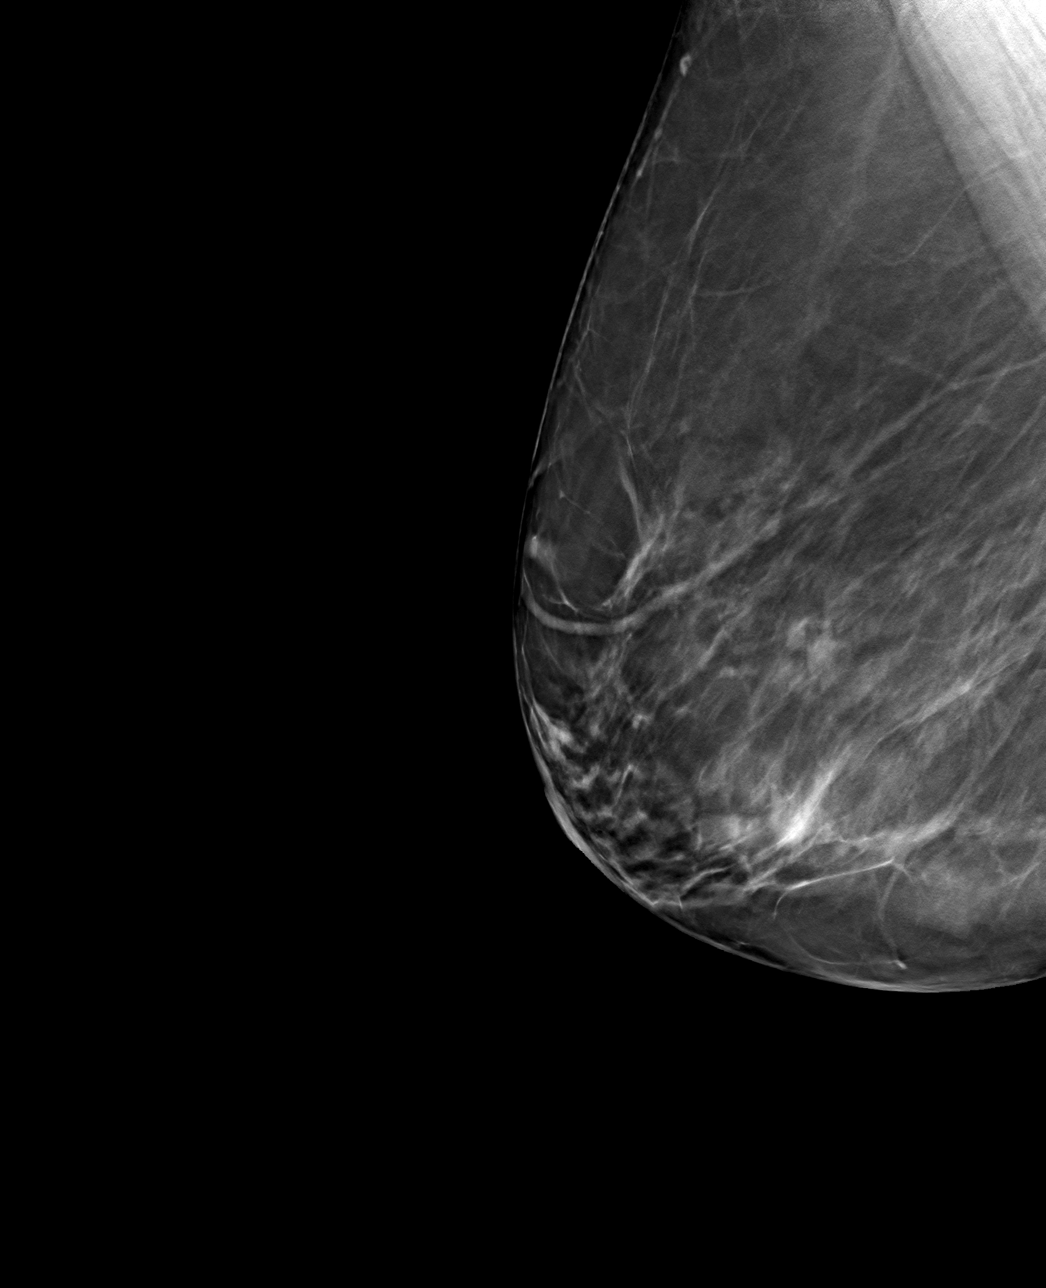

[4 of 12 positions shown; findings below may reference images not displayed]

FINDINGS: Mammographic images were obtained following ultrasound guided biopsy
of mass in the 9 o'clock location of the RIGHT breast and placement
of a ribbon shaped clip. The biopsy marking clip is along the
posterior aspect of the distortion in the UPPER-OUTER QUADRANT of
the RIGHT breast.
IMPRESSION: Appropriate positioning of the ribbon shaped biopsy marking clip at
the site of biopsy in the distortion in the UPPER-OUTER QUADRANT of
the RIGHT breast.

Final Assessment: Post Procedure Mammograms for Marker Placement

## 2021-10-26 IMAGING — US US  BREAST BX W/ LOC DEV 1ST LESION IMG BX SPEC US GUIDE*R*
1 series · 12 of 17 positions shown · non-contrast
Comparison: Previous exam(s).
COMPARISON: Previous exam(s).

Addendum:
CLINICAL DATA: Patient presents for ultrasound-guided core biopsy
of the RIGHT breast.

EXAM:
ULTRASOUND GUIDED RIGHT BREAST CORE NEEDLE BIOPSY

[Series 1: us breast bx w/ loc dev 1st lesion img bx spec us  · 0.07mm/px · 12 of 17 slices shown]
[im 1/17]
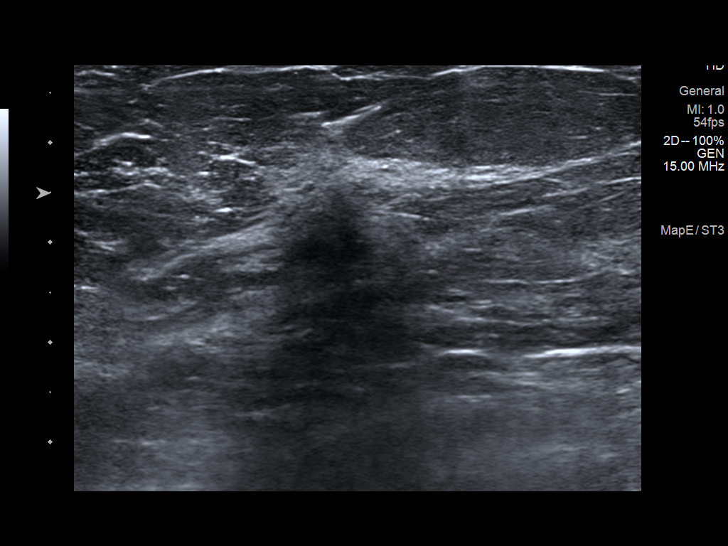
[im 3/17]
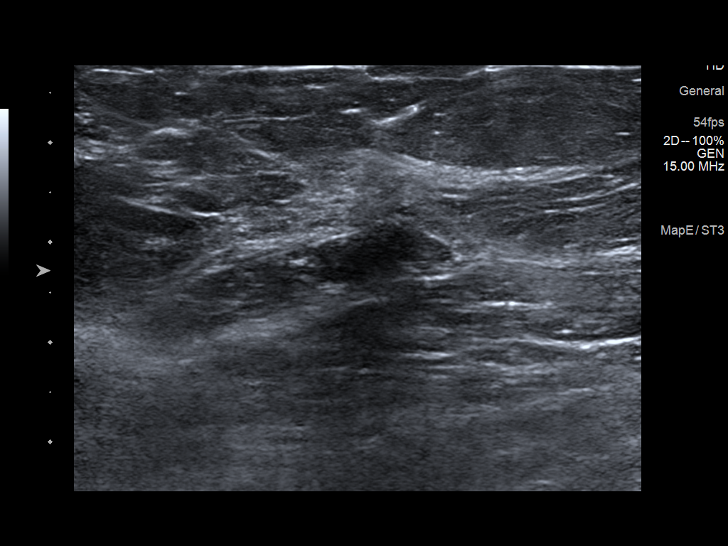
[im 4/17]
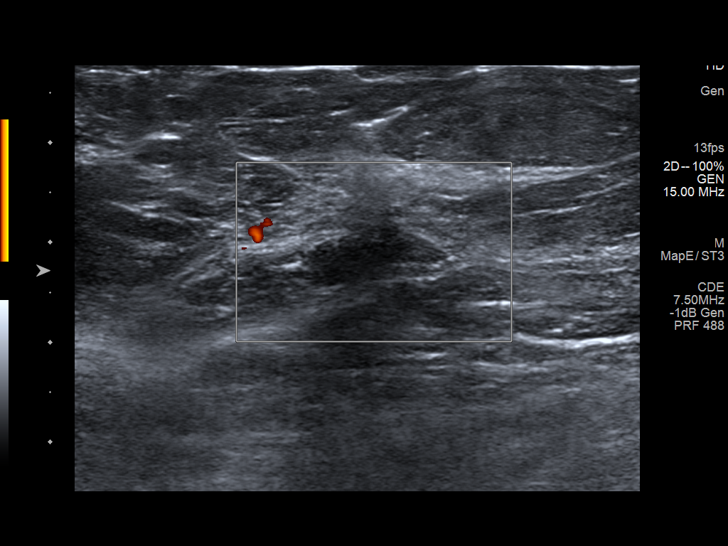
[im 5/17]
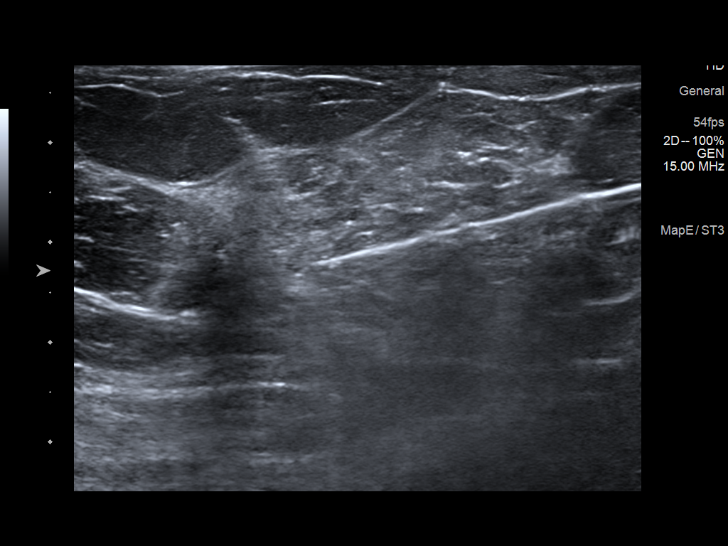
[im 7/17]
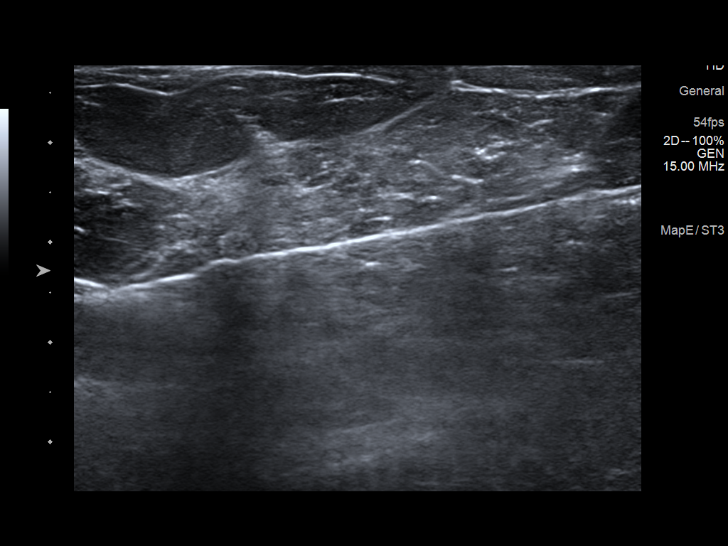
[im 8/17]
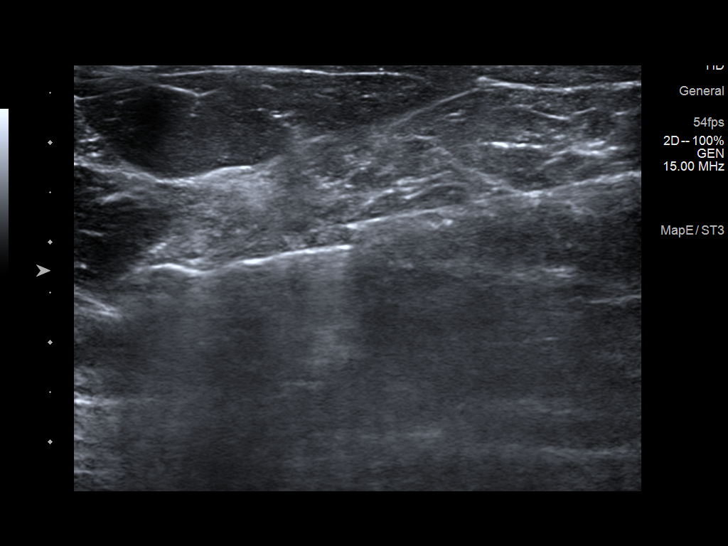
[im 10/17]
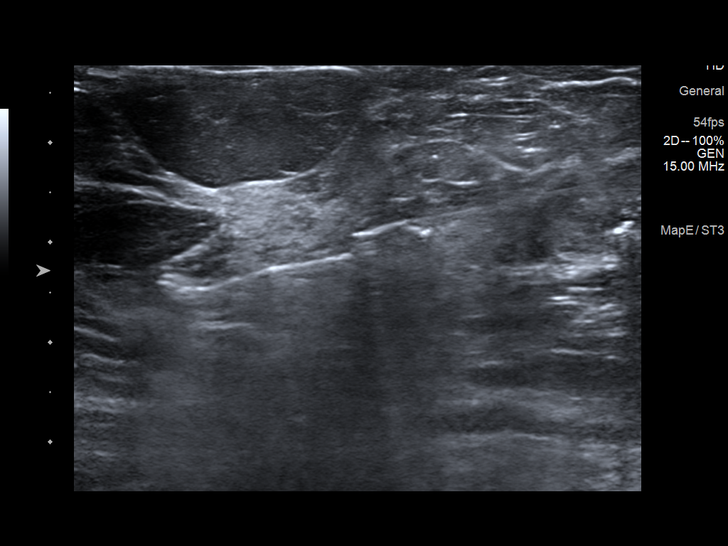
[im 11/17]
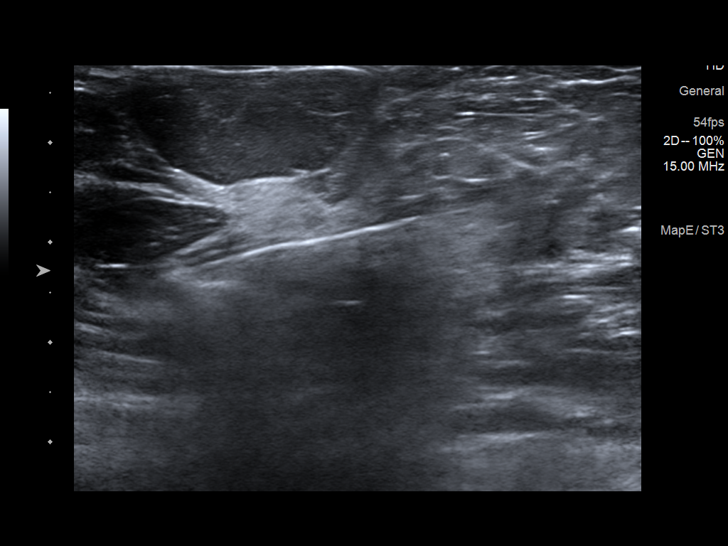
[im 13/17]
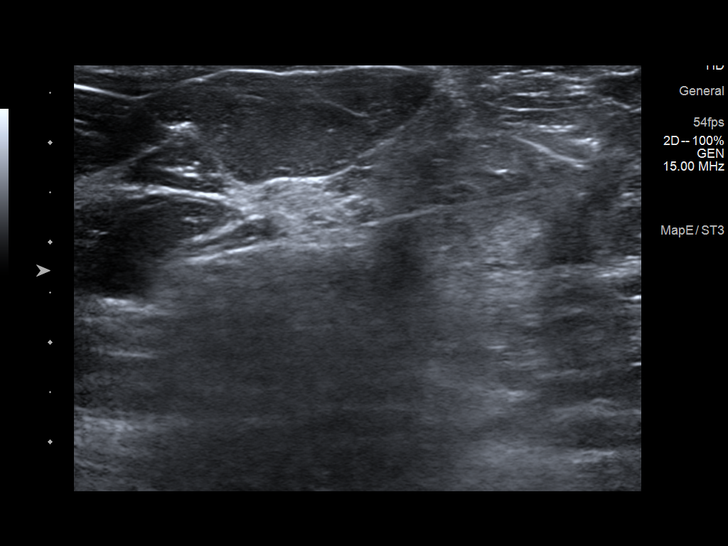
[im 14/17]
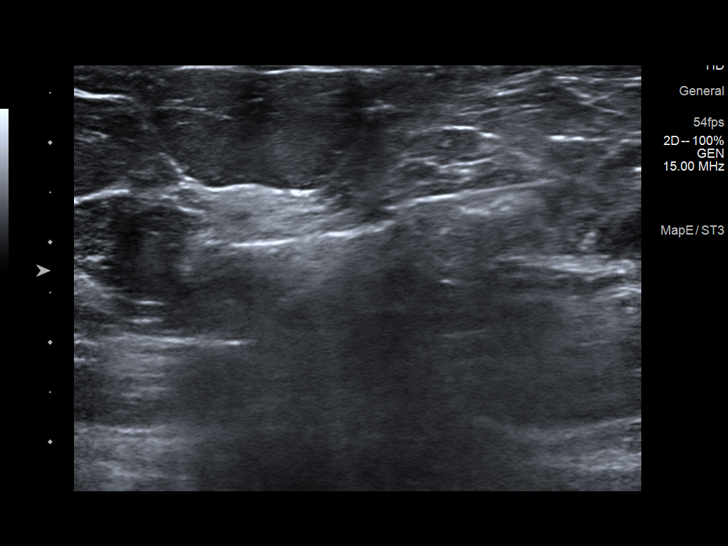
[im 15/17]
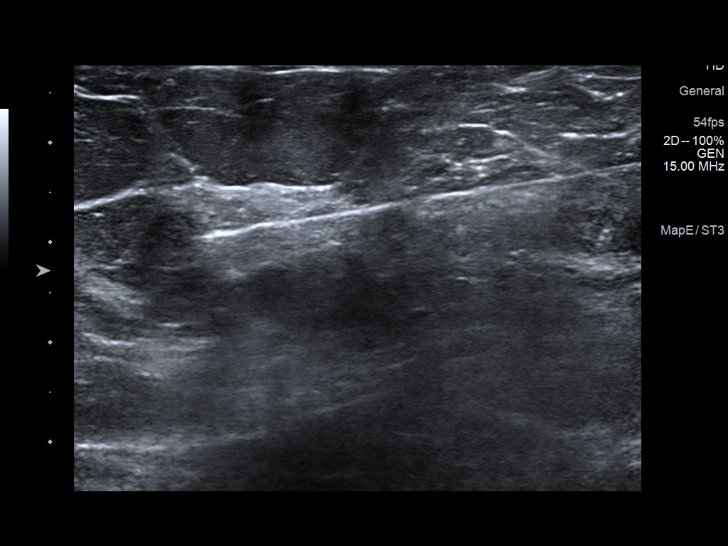
[im 17/17]
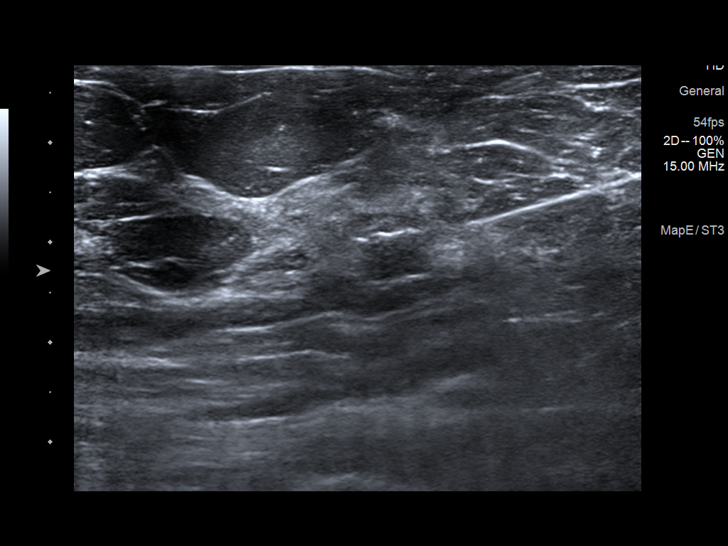

[12 of 17 positions shown; findings below may reference images not displayed]



Lesion quadrant: RIGHT breast 9 o'clock

Using sterile technique and 1% Lidocaine as local anesthetic, under
direct ultrasound visualization, a 12 gauge coaxial [place]a
device was used to perform biopsy of mass in the 9 o'clock location
of the RIGHT breast using a inferior to superior approach. At the
conclusion of the procedure ribbon shaped tissue marker clip was
deployed into the biopsy cavity. Follow up 2 view mammogram was
performed and dictated separately.
IMPRESSION: Ultrasound guided biopsy of RIGHT breast mass. No apparent
complications.

ADDENDUM:
Pathology revealed INTERMEDIATE GRADE DUCTAL CARCINOMA IN SITU
ARISING IN A COMPLEX SCLEROSING LESION of the RIGHT breast, 9
o'clock, distortion. This was found to be concordant by Dr.
Ferienhaus Erxleben.

Pathology results were discussed with the patient's daughter
(Zvaldo Orbi) and son (Hlapa Talent) by telephone with Dr.
Cladera. Results and recommendations called to [HOSPITAL] (Care
Coordinator) of [facility]]. There was not a nurse
available. The patient reported doing well after the biopsy with
tenderness at the site. Post biopsy instructions and care were
reviewed and questions were answered. The facility was encouraged to
call The [REDACTED] for any additional
concerns.

After discussion with the patient's family and care team, the
patient was referred to [REDACTED]

Pathology results reported by Masalelacollen Kalaben RN on 04/28/2021.



Lesion quadrant: RIGHT breast 9 o'clock

Using sterile technique and 1% Lidocaine as local anesthetic, under
direct ultrasound visualization, a 12 gauge coaxial Bl[place]a
device was used to perform biopsy of mass in the 9 o'clock location
of the RIGHT breast using a inferior to superior approach. At the
conclusion of the procedure ribbon shaped tissue marker clip was
deployed into the biopsy cavity. Follow up 2 view mammogram was
performed and dictated separately.
IMPRESSION: Ultrasound guided biopsy of RIGHT breast mass. No apparent
complications.

## 2021-12-21 IMAGING — MG MM BREAST SURGICAL SPECIMEN
1 series · 1 of 1 positions shown · non-contrast
Comparison: Previous exam(s).

CLINICAL DATA: Right lumpectomy for intermediate grade ductal
carcinoma in situ arising in a complex sclerosing lesion.

EXAM:
SPECIMEN RADIOGRAPH OF THE RIGHT BREAST

[R]
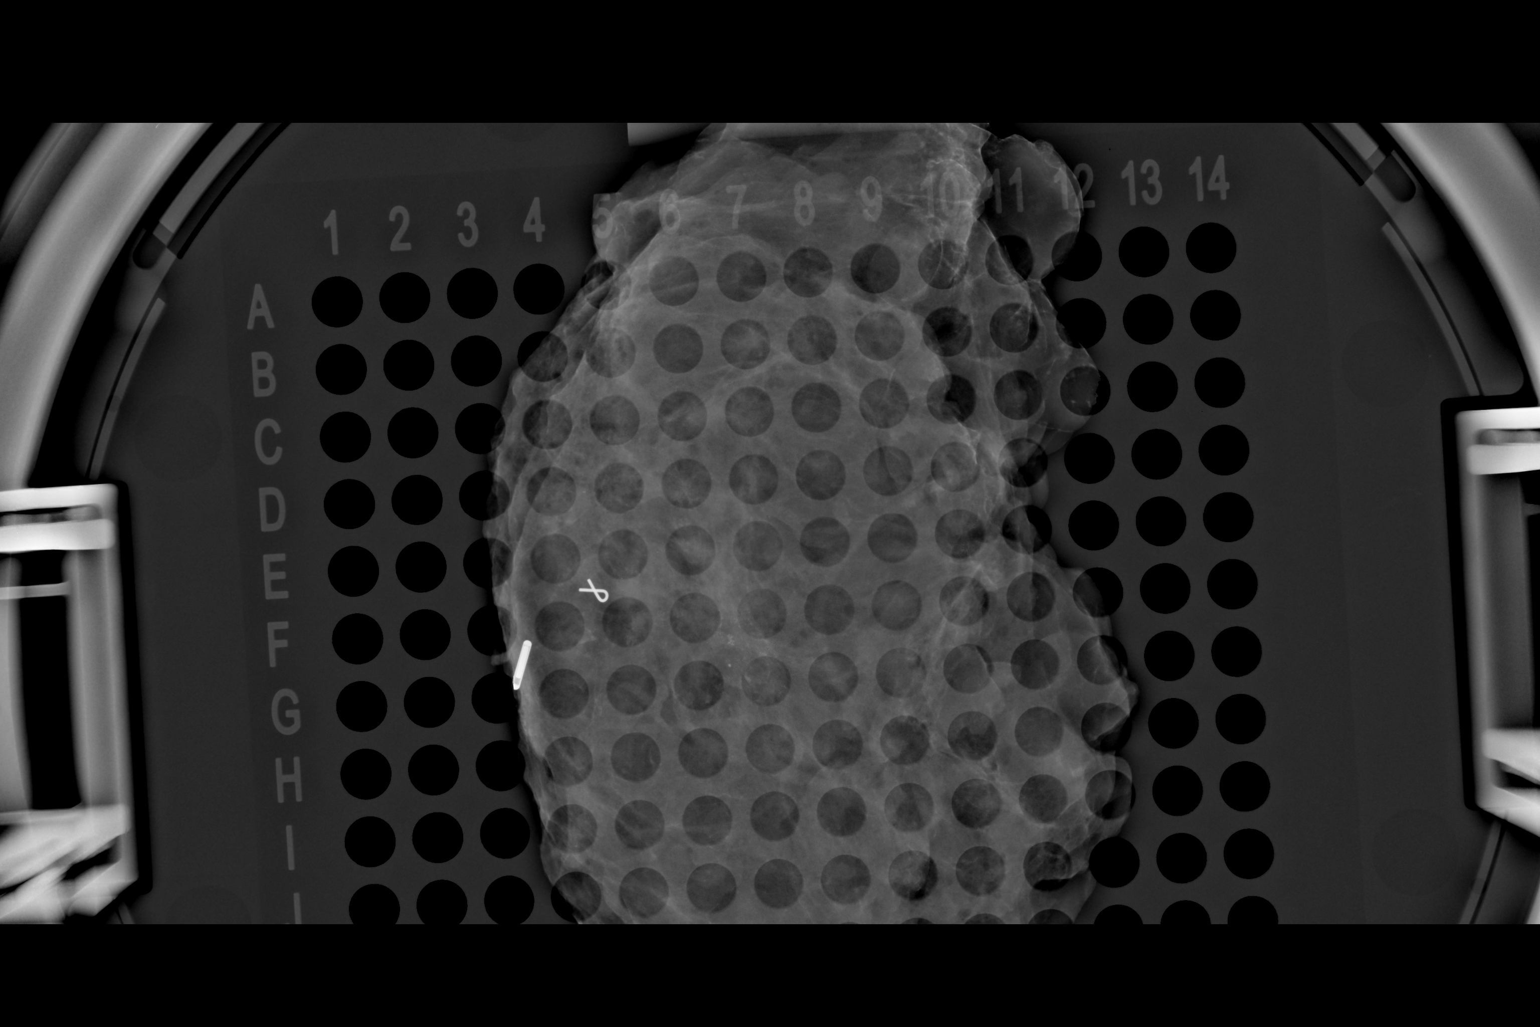

[1 of 1 positions shown; findings below may reference images not displayed]

FINDINGS: Status post excision of the right breast. The radioactive seed,
ribbon shaped biopsy marker clip and small group of calcifications
are present, completely intact, and were marked for pathology.
IMPRESSION: Specimen radiograph of the right breast.

## 2021-12-29 ENCOUNTER — Telehealth: Payer: Self-pay | Admitting: *Deleted

## 2021-12-29 NOTE — Telephone Encounter (Signed)
°  Pt is unable to attend [facility] visit. This RN talked to Case Freight forwarder, Teacher, music at [employer]. Pt resides there and is on a memory care unit.

## 2022-04-03 ENCOUNTER — Encounter (HOSPITAL_COMMUNITY): Payer: Self-pay

## 2022-06-29 ENCOUNTER — Other Ambulatory Visit: Payer: Self-pay | Admitting: Physician Assistant

## 2022-06-29 DIAGNOSIS — Z9889 Other specified postprocedural states: Secondary | ICD-10-CM

## 2022-07-09 ENCOUNTER — Ambulatory Visit
Admission: RE | Admit: 2022-07-09 | Discharge: 2022-07-09 | Disposition: A | Discharge status: Home / Self Care | Payer: Medicare Other | Source: Ambulatory Visit | Attending: Physician Assistant | Admitting: Physician Assistant

## 2022-07-09 DIAGNOSIS — Z9889 Other specified postprocedural states: Secondary | ICD-10-CM

## 2023-02-16 ENCOUNTER — Encounter (HOSPITAL_COMMUNITY): Payer: Self-pay

## 2023-02-16 ENCOUNTER — Other Ambulatory Visit: Payer: Self-pay

## 2023-02-16 ENCOUNTER — Emergency Department (HOSPITAL_COMMUNITY): Payer: Medicare Other

## 2023-02-16 ENCOUNTER — Emergency Department (HOSPITAL_COMMUNITY)
Admission: EM | Admit: 2023-02-16 | Discharge: 2023-02-16 | Disposition: A | Discharge status: Skilled Nursing Facility | Payer: Medicare Other | Attending: Emergency Medicine | Admitting: Emergency Medicine

## 2023-02-16 DIAGNOSIS — I10 Essential (primary) hypertension: Secondary | ICD-10-CM | POA: Insufficient documentation

## 2023-02-16 DIAGNOSIS — E278 Other specified disorders of adrenal gland: Secondary | ICD-10-CM | POA: Diagnosis not present

## 2023-02-16 DIAGNOSIS — Z7984 Long term (current) use of oral hypoglycemic drugs: Secondary | ICD-10-CM | POA: Diagnosis not present

## 2023-02-16 DIAGNOSIS — R079 Chest pain, unspecified: Secondary | ICD-10-CM | POA: Insufficient documentation

## 2023-02-16 DIAGNOSIS — Z79899 Other long term (current) drug therapy: Secondary | ICD-10-CM | POA: Insufficient documentation

## 2023-02-16 DIAGNOSIS — E119 Type 2 diabetes mellitus without complications: Secondary | ICD-10-CM | POA: Insufficient documentation

## 2023-02-16 DIAGNOSIS — Z853 Personal history of malignant neoplasm of breast: Secondary | ICD-10-CM | POA: Diagnosis not present

## 2023-02-16 DIAGNOSIS — F0390 Unspecified dementia without behavioral disturbance: Secondary | ICD-10-CM | POA: Diagnosis not present

## 2023-02-16 LAB — BASIC METABOLIC PANEL
Anion gap: 11 (ref 5–15)
BUN: 8 mg/dL (ref 8–23)
CO2: 22 mmol/L (ref 22–32)
Calcium: 9.2 mg/dL (ref 8.9–10.3)
Chloride: 106 mmol/L (ref 98–111)
Creatinine, Ser: 0.86 mg/dL (ref 0.44–1.00)
GFR, Estimated: >60 mL/min (ref >60)
Glucose, Bld: 101 mg/dL — ABNORMAL HIGH (ref 70–99)
Potassium: 4.1 mmol/L (ref 3.5–5.1)
Sodium: 139 mmol/L (ref 135–145)

## 2023-02-16 LAB — CBC
HCT: 37.7 % (ref 36.0–46.0)
Hemoglobin: 12.1 g/dL (ref 12.0–15.0)
MCH: 29.1 pg (ref 26.0–34.0)
MCHC: 32.1 g/dL (ref 30.0–36.0)
MCV: 90.6 fL (ref 80.0–100.0)
Platelets: 288 10*3/uL (ref 150–400)
RBC: 4.16 MIL/uL (ref 3.87–5.11)
RDW: 12.4 % (ref 11.5–15.5)
WBC: 7.7 10*3/uL (ref 4.0–10.5)
nRBC: 0 % (ref 0.0–0.2)

## 2023-02-16 LAB — TROPONIN I (HIGH SENSITIVITY)
Troponin I (High Sensitivity): 6 ng/L (ref <18)
Troponin I (High Sensitivity): 4 ng/L (ref <18)

## 2023-02-16 LAB — CBG MONITORING, ED: Glucose-Capillary: 102 mg/dL — ABNORMAL HIGH (ref 70–99)

## 2023-02-16 MED ORDER — ASPIRIN 81 MG PO CHEW: 324.0000 mg | CHEWABLE_TABLET | Freq: Once | ORAL | Status: DC

## 2023-02-16 MED ORDER — IOHEXOL 350 MG/ML SOLN
50.0000 mL | Freq: Once | INTRAVENOUS | Status: AC | PRN
  Administered 2023-02-16: 50 mL via INTRAVENOUS

## 2023-02-16 NOTE — ED Notes (Signed)
Patient transported to CT 

## 2023-02-16 NOTE — Discharge Instructions (Addendum)
Continue your current medications.  The CT scan showed an incidental adrenal nodule.  Follow-up with your primary doctor to have an outpatient MRI to make sure this is a benign lesion.  Follow-up with the cardiologist for further evaluation.  Return to the ED for recurrent episodes.

## 2023-02-16 NOTE — ED Provider Notes (Signed)
[facility] Provider Note   CSN: [id number] Arrival date & time: 02/16/23  [id number]     History  Chief Complaint  Patient presents with   Chest Pain    Lindsey Garcia is a 67 y.o. female. Pt has history of htn, dementia, diabetes, breast ca The history is provided by the patient.  Chest Pain Pain location:  Substernal area Pain quality: pressure   Pain radiates to:  Does not radiate Pain severity:  Severe Onset quality:  Gradual Duration:  1 hour Timing:  Constant Progression:  Resolved Chronicity:  New Context: not breathing, not drug use, not eating, not intercourse, not lifting and not movement   Relieved by:  Nothing Worsened by:  Nothing Ineffective treatments:  None tried Associated symptoms: no abdominal pain, no cough, no heartburn, no orthopnea and no shortness of breath   Risk factors: no coronary artery disease and no prior DVT/PE   Pt states pain resolved on its own by the time she got here.      Home Medications Prior to Admission medications   Medication Sig Start Date End Date Taking? Authorizing Provider  acetaminophen (TYLENOL) 500 MG tablet Take 500 mg by mouth every 4 (four) hours as needed for moderate pain.    [provider]  alum & mag hydroxide-simeth (MAALOX/MYLANTA) 200-200-20 MG/5ML suspension Take 30 mLs by mouth 4 (four) times daily as needed for indigestion or heartburn.    [provider]  benztropine (COGENTIN) 1 MG tablet Take 1 mg by mouth 2 (two) times daily.    [provider]  busPIRone (BUSPAR) 15 MG tablet Take 15 mg by mouth 2 (two) times daily.    [provider]  Colloidal Oatmeal 1 % CREA Apply 1 application topically daily.    [provider]  donepezil (ARICEPT) 10 MG tablet Take 10 mg by mouth at bedtime.    [provider]  guaifenesin (ROBITUSSIN) 100 MG/5ML syrup Take 200 mg by mouth every 6 (six) hours as needed for cough.     [provider]  latanoprost (XALATAN) 0.005 % ophthalmic solution Place 1 drop into both eyes every evening.    [provider]  lithium carbonate 150 MG capsule Take 150 mg by mouth daily.    [provider]  lithium carbonate 300 MG capsule Take 600 mg by mouth at bedtime.    [provider]  loperamide (IMODIUM) 2 MG capsule Take 2 mg by mouth daily as needed for diarrhea or loose stools.    [provider]  losartan (COZAAR) 25 MG tablet Take 25 mg by mouth daily.    [provider]  magnesium hydroxide (MILK OF MAGNESIA) 400 MG/5ML suspension Take 30 mLs by mouth at bedtime as needed for mild constipation.    [provider]  Melatonin 3 MG TABS Take 3 mg by mouth at bedtime.    [provider]  metFORMIN (GLUCOPHAGE) 500 MG tablet Take 500 mg by mouth daily.    [provider]  metoprolol succinate (TOPROL-XL) 25 MG 24 hr tablet Take 25 mg by mouth 2 (two) times daily.    [provider]  Neomycin-Bacitracin-Polymyxin (TRIPLE ANTIBIOTIC) [phone] OINT Apply 1 application topically as needed (wound care).    [provider]  pravastatin (PRAVACHOL) 20 MG tablet Take 20 mg by mouth at bedtime.    [provider]  risperiDONE (RISPERDAL) 3 MG tablet Take 3 mg by mouth at bedtime.  [provider]  tamoxifen (NOLVADEX) 10 MG tablet Take 1 tablet (10 mg total) by mouth daily. 06/27/21   Nicholas Lose, MD  traMADol (ULTRAM) 50 MG tablet Take 1 tablet (50 mg total) by mouth every 8 (eight) hours as needed for moderate pain or severe pain. 06/21/21   Stark Klein, MD  traZODone (DESYREL) 50 MG tablet Take 50 mg by mouth at bedtime as needed for sleep.    [provider]  Vitamin D, Ergocalciferol, (DRISDOL) 1.25 MG (50000 UNIT) CAPS capsule Take 50,000 Units by mouth every Friday.    [provider]  Jay Schlichter Oil (REFRESH P.M. OP) Place 1 drop into  both eyes every 4 (four) hours as needed (dry eyes).    [provider]      Allergies    Patient has no known allergies.    Review of Systems   Review of Systems  Respiratory:  Negative for cough and shortness of breath.   Cardiovascular:  Positive for chest pain. Negative for orthopnea.  Gastrointestinal:  Negative for abdominal pain and heartburn.    Physical Exam Updated Vital Signs BP (!) 164/124 (BP Location: Right Arm)   Pulse 70   Temp 98.9 F (37.2 C) (Oral)   Resp 18   LMP  (LMP Unknown)   SpO2 99%  Physical Exam Vitals and nursing note reviewed.  Constitutional:      General: She is not in acute distress.    Appearance: She is well-developed.  HENT:     Head: Normocephalic and atraumatic.     Right Ear: External ear normal.     Left Ear: External ear normal.  Eyes:     General: No scleral icterus.       Right eye: No discharge.        Left eye: No discharge.     Conjunctiva/sclera: Conjunctivae normal.  Neck:     Trachea: No tracheal deviation.  Cardiovascular:     Rate and Rhythm: Normal rate and regular rhythm.  Pulmonary:     Effort: Pulmonary effort is normal. No respiratory distress.     Breath sounds: Normal breath sounds. No stridor. No wheezing or rales.  Abdominal:     General: Bowel sounds are normal. There is no distension.     Palpations: Abdomen is soft.     Tenderness: There is no abdominal tenderness. There is no guarding or rebound.  Musculoskeletal:        General: No tenderness or deformity.     Cervical back: Neck supple.  Skin:    General: Skin is warm and dry.     Findings: No rash.  Neurological:     General: No focal deficit present.     Mental Status: She is alert.     Cranial Nerves: No cranial nerve deficit, dysarthria or facial asymmetry.     Sensory: No sensory deficit.     Motor: Tremor present. No abnormal muscle tone or seizure activity.     Coordination: Coordination normal.  Psychiatric:        Mood and  Affect: Mood normal.     ED Results / Procedures / Treatments   Labs (all labs ordered are listed, but only abnormal results are displayed) Labs Reviewed  BASIC METABOLIC PANEL - Abnormal; Notable for the following components:      Result Value   Glucose, Bld 101 (*)    All other components within normal limits  CBG MONITORING, ED - Abnormal; Notable for the  following components:   Glucose-Capillary 102 (*)    All other components within normal limits  CBC  TROPONIN I (HIGH SENSITIVITY)  TROPONIN I (HIGH SENSITIVITY)    EKG EKG Interpretation  Date/Time:  Saturday February 16 2023 08:23:01 EST Ventricular Rate:  51 PR Interval:    QRS Duration: 96 QT Interval:  423 QTC Calculation: 390 R Axis:   61 Text Interpretation: Atrial flutter Low voltage, precordial leads new since last tracing Confirmed by Dorie Rank [phone]) on 02/16/2023 8:29:50 AM  Radiology CT Chest W Contrast  Result Date: 02/16/2023 CLINICAL DATA:  67 year old female with chest pain. Possible mediastinal abnormality on chest radiograph today EXAM: CT CHEST WITH CONTRAST TECHNIQUE: Multidetector CT imaging of the chest was performed during intravenous contrast administration. RADIATION DOSE REDUCTION: This exam was performed according to the departmental dose-optimization program which includes automated exposure control, adjustment of the mA and/or kV according to patient size and/or use of iterative reconstruction technique. CONTRAST:  57m OMNIPAQUE IOHEXOL 350 MG/ML SOLN COMPARISON:  02/16/2023 chest radiograph FINDINGS: Cardiovascular: UPPER limits normal heart size noted. Coronary artery and aortic atherosclerotic calcifications are identified. There is no evidence of thoracic aortic aneurysm or pericardial effusion. Mediastinum/Nodes: Substernal thyroid goiter is noted. The visualized trachea and esophagus are unremarkable. There is no evidence of abnormality in the area of the questioned radiographic finding. No  abnormal appearing lymph nodes are identified. Lungs/Pleura: Minimal bilateral dependent and basilar atelectasis/scarring noted. There is no evidence of airspace disease, consolidation, mass, suspicious nodule, pleural effusion or pneumothorax. Upper Abdomen: A 3.5 cm RIGHT adrenal nodule measures 37 Hounsfield units and is indeterminate. Hepatic steatosis and cholelithiasis identified. Musculoskeletal: No acute or suspicious bony abnormalities are noted. RIGHT breast surgical changes are present. IMPRESSION: 1. No evidence of acute abnormality. No CT abnormality in the area of the questioned radiographic finding. 2. 3.5 cm indeterminate RIGHT adrenal nodule. Recommend further evaluation with elective adrenal MRI with and without contrast. 3. Substernal thyroid goiter. 4. Hepatic steatosis and cholelithiasis. 5. Coronary artery disease and Aortic Atherosclerosis (ICD10-I70.0). Electronically Signed   By: JMargarette CanadaM.D.   On: 02/16/2023 15:06   DG Chest Portable 1 View  Result Date: 02/16/2023 CLINICAL DATA:  Chest pain EXAM: PORTABLE CHEST 1 VIEW COMPARISON:  None Available. FINDINGS: Heart size is within normal limits. Questionable rounded opacity within the mid/lower mediastinum. Lungs are clear. No pleural effusion or pneumothorax is seen. Osseous structures about the chest are unremarkable. IMPRESSION: 1. Questionable rounded opacity within the mid/lower mediastinum. This could represent a mediastinal mass, lymphadenopathy or large hiatal hernia. Recommend further characterization with chest CT. 2. Lungs are clear. No evidence of pneumonia or pulmonary edema. Electronically Signed   By: SFranki CabotM.D.   On: 02/16/2023 09:03    Procedures .1-3 Lead EKG Interpretation  Performed by: KDorie Rank MD Authorized by: KDorie Rank MD     Interpretation: normal     ECG rate:  55   ECG rate assessment: normal     Rhythm: sinus rhythm     Ectopy: none     Conduction: normal   Comments:     No flutter  noted on monitor     Medications Ordered in ED Medications  aspirin chewable tablet 324 mg (324 mg Oral Not Given 02/16/23 0929)  iohexol (OMNIPAQUE) 350 MG/ML injection 50 mL (50 mLs Intravenous Contrast Given 02/16/23 1347)    ED Course/ Medical Decision Making/ A&P Clinical Course as of 02/16/23 1512  Sat Feb 16, 2023  1113 Chest x-ray shows questionable rounded opacity within the lower mediastinum.  Chest CT recommended for further evaluation.  Possible mass lymphadenopathy or large radial hernia [JK]  1228 Initial troponin normal.  Metabolic panel normal. [JK]    Clinical Course User Index [JK] Dorie Rank, MD                             Medical Decision Making Differential gnosis includes but not limited to ACS, pneumonia, pneumothorax GERD, esophagitis  Problems Addressed: Adrenal nodule [facility]): acute illness or injury Chest pain, unspecified type: acute illness or injury that poses a threat to life or bodily functions  Amount and/or Complexity of Data Reviewed Labs: ordered. Decision-making details documented in ED Course. Radiology: ordered and independent interpretation performed.  Risk OTC drugs. Prescription drug management.   ED workup reassuring.  Initial EKG suggested atrial flutter but I suspect this was related to her tremor.  Patient had normal sinus rhythm on the cardiac monitor.  No signs of any ischemic changes on EKG and her serial troponins are normal.  I doubt ACS.  No evidence of pneumonia but there was questionable mass on the chest x-ray.  CT scan of her chest with contrast was ordered and there is no signs of any acute abnormality.  Incidental adrenal mass noted.  Will have patient follow-up with PCP to have that rechecked.   Continuing her age and risk factors we will have her follow-up with cardiology as an outpatient       Final Clinical Impression(s) / ED Diagnoses Final diagnoses:  Chest pain, unspecified type  Adrenal nodule ([place])     Rx / DC Orders ED Discharge Orders          Ordered    Ambulatory referral to Cardiology       Comments: If you have not heard from the Cardiology office within the next 72 hours please call [phone].   02/16/23 1253              Dorie Rank, MD 02/16/23 1512

## 2023-02-16 NOTE — ED Notes (Signed)
[facility] called again, 2nd in line

## 2023-02-16 NOTE — ED Triage Notes (Signed)
Pt BIB EMS due to chest pain. Pt from [facility]. Pt woke up and had chest pain in center of chest 10/10. Pt RECEIVED '324MG'$  OF ASPIRIN. Pt has tremors and is axox3 at baseline.

## 2023-02-16 NOTE — ED Notes (Signed)
[facility] called, 4th in line

## 2023-02-16 NOTE — ED Notes (Signed)
RN called lab 3 times. Lab states they are running blood work now.

## 2023-02-16 NOTE — ED Notes (Signed)
RN called lab to confirm they are running labs.

## 2023-03-12 NOTE — Progress Notes (Signed)
CARDIOLOGY CONSULT NOTE       Patient ID: Lindsey Garcia MRN: [id number] DOB/AGE: 65-Mar-1957 65 y.o.  Admit date: (Not on file) Referring Physician: [place] Dr Tomi Bamberger Primary Physician: Sande Brothers, MD Primary Cardiologist: New Reason for Consultation: Chest Pain  Active Problems:   * No active hospital problems. *   HPI:  67 y.o. referred by Dr Mary Sella ED for chest pain . History of HTN, ETOH abuse, Dementia, DM and breast cancer Seen in ED 02/16/23  Pain lasted about 1 hour substernal no radiation and resolved spontaneously Troponin negative x 2 ECG non acute CT chest non contrast ok no mediastinal mass D/c home  She is homebound resident of assisted living care requiring 24/7 supervision   Attendant who knows her well last 2 years indicates no SSCP She moves will with rollator She has severe UE tremors especially when anxious Gets out of facility for lunch sometimes No dyspnea, syncope palpitations  with mild LE edema  ROS All other systems reviewed and negative except as noted above  Past Medical History:  Diagnosis Date   Alzheimer disease ([place])    Amnestic disorder    Breast cancer ([place])    Carcinoma in situ of right breast    Chest pain    Dementia ([place])    Diabetes mellitus without complication ([place])    History of alcohol abuse    Hyperlipidemia    Hypertension    Incontinence    Insomnia    Pain in right hip    Personal history of urinary (tract) infections    Schizophrenia ([place])    Tinea pedis    Vitamin D deficiency    Xerosis cutis     No family history on file.  Social History   Socioeconomic History   Marital status: Single    Spouse name: Not on file   Number of children: Not on file   Years of education: Not on file   Highest education level: Not on file  Occupational History   Not on file  Tobacco Use   Smoking status: Some Days    Types: Cigarettes   Smokeless tobacco: Never  Substance and Sexual Activity   Alcohol use: No   Drug  use: Not Currently   Sexual activity: Not on file  Other Topics Concern   Not on file  Social History Narrative   Not on file   Social Determinants of Health   Financial Resource Strain: Not on file  Food Insecurity: Not on file  Transportation Needs: Not on file  Physical Activity: Not on file  Stress: Not on file  Social Connections: Not on file  Intimate Partner Violence: Not on file    Past Surgical History:  Procedure Laterality Date   BREAST LUMPECTOMY     BREAST LUMPECTOMY WITH RADIOACTIVE SEED LOCALIZATION Right 06/21/2021   Procedure: RIGHT BREAST LUMPECTOMY WITH RADIOACTIVE SEED LOCALIZATION;  Surgeon: Stark Klein, MD;  Location: [facility];  Service: General;  Laterality: Right;   TUBAL LIGATION        Current Outpatient Medications:    acetaminophen (TYLENOL) 500 MG tablet, Take 500 mg by mouth every 4 (four) hours as needed for moderate pain., Disp: , Rfl:    alum & mag hydroxide-simeth (MAALOX/MYLANTA) 200-200-20 MG/5ML suspension, Take 30 mLs by mouth 4 (four) times daily as needed for indigestion or heartburn., Disp: , Rfl:    benztropine (COGENTIN) 1 MG tablet, Take 1 mg by mouth 2 (two) times daily., Disp: ,  Rfl:    busPIRone (BUSPAR) 15 MG tablet, Take 15 mg by mouth 2 (two) times daily., Disp: , Rfl:    Colloidal Oatmeal 1 % CREA, Apply 1 application topically daily., Disp: , Rfl:    donepezil (ARICEPT) 10 MG tablet, Take 10 mg by mouth at bedtime., Disp: , Rfl:    latanoprost (XALATAN) 0.005 % ophthalmic solution, Place 1 drop into both eyes every evening., Disp: , Rfl:    lithium carbonate 150 MG capsule, Take 150 mg by mouth daily., Disp: , Rfl:    loperamide (IMODIUM) 2 MG capsule, Take 2 mg by mouth daily as needed for diarrhea or loose stools., Disp: , Rfl:    losartan (COZAAR) 25 MG tablet, Take 25 mg by mouth daily., Disp: , Rfl:    magnesium hydroxide (MILK OF MAGNESIA) 400 MG/5ML suspension, Take 30 mLs by mouth at bedtime as needed for mild  constipation., Disp: , Rfl:    Melatonin 3 MG TABS, Take 3 mg by mouth at bedtime., Disp: , Rfl:    metFORMIN (GLUCOPHAGE) 500 MG tablet, Take 500 mg by mouth daily., Disp: , Rfl:    metoprolol succinate (TOPROL-XL) 25 MG 24 hr tablet, Take 25 mg by mouth 2 (two) times daily., Disp: , Rfl:    pravastatin (PRAVACHOL) 20 MG tablet, Take 20 mg by mouth at bedtime., Disp: , Rfl:    risperiDONE (RISPERDAL) 3 MG tablet, Take 3 mg by mouth at bedtime., Disp: , Rfl:    tamoxifen (NOLVADEX) 10 MG tablet, Take 1 tablet (10 mg total) by mouth daily., Disp: 90 tablet, Rfl: 3   traZODone (DESYREL) 50 MG tablet, Take 50 mg by mouth at bedtime as needed for sleep., Disp: , Rfl:    Vitamin D, Ergocalciferol, (DRISDOL) 1.25 MG (50000 UNIT) CAPS capsule, Take 50,000 Units by mouth every Friday., Disp: , Rfl:    White Petrolatum-Mineral Oil (REFRESH P.M. OP), Place 1 drop into both eyes every 4 (four) hours as needed (dry eyes)., Disp: , Rfl:    guaifenesin (ROBITUSSIN) 100 MG/5ML syrup, Take 200 mg by mouth every 6 (six) hours as needed for cough., Disp: , Rfl:    lithium carbonate 300 MG capsule, Take 600 mg by mouth at bedtime., Disp: , Rfl:    Neomycin-Bacitracin-Polymyxin (TRIPLE ANTIBIOTIC) [phone] OINT, Apply 1 application topically as needed (wound care)., Disp: , Rfl:    traMADol (ULTRAM) 50 MG tablet, Take 1 tablet (50 mg total) by mouth every 8 (eight) hours as needed for moderate pain or severe pain. (Patient not taking: Reported on 03/19/2023), Disp: 10 tablet, Rfl: 0    Physical Exam: Blood pressure 126/76, pulse 70, height 5\' 5"  (1.651 m), weight 227 lb 3.2 oz (103.1 kg), SpO2 97 %.    Affect appropriate Chronically ill female  HEENT: normal Neck supple with no adenopathy JVP normal no bruits no thyromegaly Lungs clear with no wheezing and good diaphragmatic motion Heart:  S1/S2 no murmur, no rub, gallop or click PMI normal Abdomen: benighn, BS positve, no tenderness, no AAA no bruit.   No HSM or HJR Distal pulses intact with no bruits No edema Neuro non-focal Skin warm and dry No muscular weakness   Labs:   Lab Results  Component Value Date   WBC 7.7 02/16/2023   HGB 12.1 02/16/2023   HCT 37.7 02/16/2023   MCV 90.6 02/16/2023   PLT 288 02/16/2023   No results for input(s): "NA", "K", "CL", "CO2", "BUN", "CREATININE", "CALCIUM", "PROT", "BILITOT", "ALKPHOS", "ALT", "AST", "  GLUCOSE" in the last 168 hours.  Invalid input(s): "LABALBU" No results found for: "CKTOTAL", "CKMB", "CKMBINDEX", "TROPONINI" No results found for: "CHOL" No results found for: "HDL" No results found for: "LDLCALC" No results found for: "TRIG" No results found for: "CHOLHDL" No results found for: "LDLDIRECT"    Radiology: No results found.  EKG: SR rate 51 normal called flutter but due to patient tremor    ASSESSMENT AND PLAN:   Chest Pain:  atypical r/o CXR/CT ok ECG normal no need for further w/u  HTN:  controlled on losartan and lopressor Dementia: ? Related to ETOH abuse on Aricept lithium risperdal and trazodone Consider d/c Aricept with bradycardia HLD:  continue statin labs with primary DM:  On glucophage A1c with primary Breast Cancer:  f/u [place] on Tamoxifen Post right breast lumpectomy June 2022   No testing needed   F/U cardiology PRN   Signed: Jenkins Rouge 03/19/2023, 10:53 AM

## 2023-03-19 ENCOUNTER — Encounter: Payer: Self-pay | Admitting: Cardiovascular Disease

## 2023-03-19 ENCOUNTER — Ambulatory Visit: Payer: Medicare Other | Attending: Cardiovascular Disease | Admitting: Cardiovascular Disease

## 2023-03-19 VITALS — BP 126/76 | HR 70 | Ht 65.0 in | Wt 227.2 lb

## 2023-03-19 DIAGNOSIS — E782 Mixed hyperlipidemia: Secondary | ICD-10-CM | POA: Insufficient documentation

## 2023-03-19 DIAGNOSIS — R072 Precordial pain: Secondary | ICD-10-CM | POA: Diagnosis not present

## 2023-03-19 DIAGNOSIS — I10 Essential (primary) hypertension: Secondary | ICD-10-CM | POA: Diagnosis not present

## 2023-03-19 NOTE — Patient Instructions (Signed)
Medication Instructions:  Your physician recommends that you continue on your current medications as directed. Please refer to the Current Medication list given to you today.  *If you need a refill on your cardiac medications before your next appointment, please call your pharmacy*  Lab Work: If you have labs (blood work) drawn today and your tests are completely normal, you will receive your results only by: MyChart Message (if you have MyChart) OR A paper copy in the mail If you have any lab test that is abnormal or we need to change your treatment, we will call you to review the results.  Testing/Procedures: None ordered today.  Follow-Up: At [facility], you and your health needs are our priority.  As part of our continuing mission to provide you with exceptional heart care, we have created designated Provider Care Teams.  These Care Teams include your primary Cardiologist (physician) and Advanced Practice Providers (APPs -  Physician Assistants and Nurse Practitioners) who all work together to provide you with the care you need, when you need it.  We recommend signing up for the patient portal called "MyChart".  Sign up information is provided on this After Visit Summary.  MyChart is used to connect with patients for Virtual Visits (Telemedicine).  Patients are able to view lab/test results, encounter notes, upcoming appointments, etc.  Non-urgent messages can be sent to your provider as well.   To learn more about what you can do with MyChart, go to [email or web address].    Your next appointment:   As needed   Provider:   Peter Nishan, MD      

## 2023-06-03 ENCOUNTER — Other Ambulatory Visit: Payer: Self-pay | Admitting: Physician Assistant

## 2023-06-03 DIAGNOSIS — Z1231 Encounter for screening mammogram for malignant neoplasm of breast: Secondary | ICD-10-CM

## 2023-07-15 ENCOUNTER — Ambulatory Visit: Payer: Medicare Other

## 2024-01-26 ENCOUNTER — Emergency Department (HOSPITAL_COMMUNITY): Payer: Medicare Other

## 2024-01-26 ENCOUNTER — Other Ambulatory Visit: Payer: Self-pay

## 2024-01-26 ENCOUNTER — Emergency Department (HOSPITAL_COMMUNITY)
Admission: EM | Admit: 2024-01-26 | Discharge: 2024-01-27 | Disposition: A | Discharge status: Home / Self Care | Payer: Medicare Other | Attending: Emergency Medicine | Admitting: Emergency Medicine

## 2024-01-26 DIAGNOSIS — M25551 Pain in right hip: Secondary | ICD-10-CM | POA: Insufficient documentation

## 2024-01-26 DIAGNOSIS — F0390 Unspecified dementia without behavioral disturbance: Secondary | ICD-10-CM | POA: Diagnosis not present

## 2024-01-26 DIAGNOSIS — S0990XA Unspecified injury of head, initial encounter: Secondary | ICD-10-CM | POA: Insufficient documentation

## 2024-01-26 DIAGNOSIS — W19XXXA Unspecified fall, initial encounter: Secondary | ICD-10-CM | POA: Diagnosis not present

## 2024-01-26 DIAGNOSIS — E119 Type 2 diabetes mellitus without complications: Secondary | ICD-10-CM | POA: Diagnosis not present

## 2024-01-26 DIAGNOSIS — Z7984 Long term (current) use of oral hypoglycemic drugs: Secondary | ICD-10-CM | POA: Diagnosis not present

## 2024-01-26 LAB — CBG MONITORING, ED: Glucose-Capillary: 206 mg/dL — ABNORMAL HIGH (ref 70–99)

## 2024-01-26 MED ORDER — ACETAMINOPHEN 500 MG PO TABS
1000.0000 mg | ORAL_TABLET | Freq: Once | ORAL | Status: AC
  Administered 2024-01-26: 1000 mg via ORAL
  Filled 2024-01-26: qty 2

## 2024-01-26 NOTE — ED Provider Notes (Signed)
[facility] AT [facility] Provider Note   CSN: [id number] Arrival date & time: 01/26/24  2041     History  Chief Complaint  Patient presents with   Lindsey Garcia is a 68 y.o. female.  68 year old female with prior medical history as detailed below presents for evaluation.  Patient arrives with EMS transport from [facility].   Apparently the patient was with her daughter earlier today.  She had a fall.  The details of this fall are unclear.  The patient is without significant complaint on my evaluation.  She cannot recall the events of the fall.  Patient does seem to have some mild tenderness to the right lateral hip.  She has a history of dementia and diabetes.  She has a resting tremor at baseline.  She is alert and conversational during exam.  She is without specific acute complaint.  The history is provided by the patient and medical records.       Home Medications Prior to Admission medications   Medication Sig Start Date End Date Taking? Authorizing Provider  acetaminophen (TYLENOL) 500 MG tablet Take 500 mg by mouth every 4 (four) hours as needed for moderate pain.    [provider]  alum & mag hydroxide-simeth (MAALOX/MYLANTA) 200-200-20 MG/5ML suspension Take 30 mLs by mouth 4 (four) times daily as needed for indigestion or heartburn.    [provider]  benztropine (COGENTIN) 1 MG tablet Take 1 mg by mouth 2 (two) times daily.    [provider]  busPIRone (BUSPAR) 15 MG tablet Take 15 mg by mouth 2 (two) times daily.    [provider]  Colloidal Oatmeal 1 % CREA Apply 1 application topically daily.    [provider]  donepezil (ARICEPT) 10 MG tablet Take 10 mg by mouth at bedtime.    [provider]  guaifenesin (ROBITUSSIN) 100 MG/5ML syrup Take 200 mg by mouth every 6 (six) hours as needed for cough.    [provider]  latanoprost (XALATAN) 0.005 %  ophthalmic solution Place 1 drop into both eyes every evening.    [provider]  lithium carbonate 150 MG capsule Take 150 mg by mouth daily.    [provider]  lithium carbonate 300 MG capsule Take 600 mg by mouth at bedtime.    [provider]  loperamide (IMODIUM) 2 MG capsule Take 2 mg by mouth daily as needed for diarrhea or loose stools.    [provider]  losartan (COZAAR) 25 MG tablet Take 25 mg by mouth daily.    [provider]  magnesium hydroxide (MILK OF MAGNESIA) 400 MG/5ML suspension Take 30 mLs by mouth at bedtime as needed for mild constipation.    [provider]  Melatonin 3 MG TABS Take 3 mg by mouth at bedtime.    [provider]  metFORMIN (GLUCOPHAGE) 500 MG tablet Take 500 mg by mouth daily.    [provider]  metoprolol succinate (TOPROL-XL) 25 MG 24 hr tablet Take 25 mg by mouth 2 (two) times daily.    [provider]  Neomycin-Bacitracin-Polymyxin (TRIPLE ANTIBIOTIC) [phone] OINT Apply 1 application topically as needed (wound care).    [provider]  pravastatin (PRAVACHOL) 20 MG tablet Take 20 mg by mouth at bedtime.    [provider]  risperiDONE (RISPERDAL) 3 MG tablet Take 3 mg by mouth at bedtime.    [provider]  tamoxifen (NOLVADEX)  10 MG tablet Take 1 tablet (10 mg total) by mouth daily. 06/27/21   Serena Croissant, MD  traMADol (ULTRAM) 50 MG tablet Take 1 tablet (50 mg total) by mouth every 8 (eight) hours as needed for moderate pain or severe pain. Patient not taking: Reported on 03/19/2023 06/21/21   Almond Lint, MD  traZODone (DESYREL) 50 MG tablet Take 50 mg by mouth at bedtime as needed for sleep.    [provider]  Vitamin D, Ergocalciferol, (DRISDOL) 1.25 MG (50000 UNIT) CAPS capsule Take 50,000 Units by mouth every Friday.    [provider]  Rayburn Ma Oil (REFRESH P.M. OP) Place 1 drop into both eyes  every 4 (four) hours as needed (dry eyes).    [provider]      Allergies    Patient has no known allergies.    Review of Systems   Review of Systems  All other systems reviewed and are negative.   Physical Exam Updated Vital Signs LMP  (LMP Unknown)  Physical Exam Vitals and nursing note reviewed.  Constitutional:      General: She is not in acute distress.    Appearance: Normal appearance. She is well-developed.     Comments: Alert, no acute complaint, baseline tremor present.  HENT:     Head: Normocephalic and atraumatic.  Eyes:     Conjunctiva/sclera: Conjunctivae normal.     Pupils: Pupils are equal, round, and reactive to light.  Cardiovascular:     Rate and Rhythm: Normal rate and regular rhythm.     Heart sounds: Normal heart sounds.  Pulmonary:     Effort: Pulmonary effort is normal. No respiratory distress.     Breath sounds: Normal breath sounds.  Abdominal:     General: There is no distension.     Palpations: Abdomen is soft.     Tenderness: There is no abdominal tenderness.  Musculoskeletal:        General: Tenderness present. No deformity. Normal range of motion.     Cervical back: Normal range of motion and neck supple.     Comments: Mild tenderness to the lateral aspect of the right hip with deep palpation.  Skin:    General: Skin is warm and dry.  Neurological:     General: No focal deficit present.     Mental Status: She is alert and oriented to person, place, and time.     ED Results / Procedures / Treatments   Labs (all labs ordered are listed, but only abnormal results are displayed) Labs Reviewed  CBG MONITORING, ED - Abnormal; Notable for the following components:      Result Value   Glucose-Capillary 206 (*)    All other components within normal limits    EKG None  Radiology No results found.  Procedures Procedures    Medications Ordered in ED Medications  acetaminophen (TYLENOL) tablet 1,000 mg (has no  administration in time range)    ED Course/ Medical Decision Making/ A&P                                 Medical Decision Making Amount and/or Complexity of Data Reviewed Radiology: ordered.  Risk OTC drugs.    Medical Screen Complete  This patient presented to the ED with complaint of fall.  This complaint involves an extensive number of treatment options. The initial differential diagnosis includes, but is not limited to, from fall  This presentation is: Acute, Self-Limited, Previously Undiagnosed, Uncertain Prognosis, Complicated, Systemic Symptoms, and Threat to Life/Bodily Function Patient with reported fall earlier today.  Patient without overt evidence of significant trauma on exam.  Given history of dementia screening imaging ordered.  Imaging is reassuringly without acute pathology.  Patient appears to be comfortable and at baseline.  Patient is appropriate for discharge back to her facility.  Additional history obtained:  External records from outside sources obtained and reviewed including prior ED visits and prior Inpatient records.   Imaging Studies ordered:  I ordered imaging studies including CT head, plain films of chest and pelvis and right hip I independently visualized and interpreted obtained imaging which showed NAD I agree with the radiologist interpretation.   Cardiac Monitoring:  The patient was maintained on a cardiac monitor.  I personally viewed and interpreted the cardiac monitor which showed an underlying rhythm of: NSR    Medicines ordered:  I ordered medication including Tylenol  for pain Reevaluation of the patient after these medicines showed that the patient: improved    Problem List / ED Course:  Fall   Reevaluation:  After the interventions noted above, I reevaluated the patient and found that they have: improved Disposition:  After consideration of the diagnostic results and the patients response to treatment, I  feel that the patent would benefit from close outpatient follow-up.          Final Clinical Impression(s) / ED Diagnoses Final diagnoses:  Fall, initial encounter    Rx / DC Orders ED Discharge Orders     None         Wynetta Fines, MD 01/26/24 2239

## 2024-01-26 NOTE — Discharge Instructions (Signed)
 Return for any problem.  ?

## 2024-01-26 NOTE — ED Notes (Signed)
 Patient transported to CT

## 2024-01-26 NOTE — ED Notes (Signed)
Ptar transport setup for pt.  

## 2024-01-26 NOTE — ED Notes (Signed)
 X-ray at bedside

## 2024-01-26 NOTE — ED Notes (Signed)
 Pt returned from CT

## 2024-01-26 NOTE — ED Notes (Signed)
Pt ambulated with walker and assistance

## 2024-01-26 NOTE — ED Triage Notes (Signed)
Pt BIB [facility] from [facility]. Pt daughter reports pt fell onto her walker. Unknown if she hit her head. Pt Hx diabetes, dementia. CBG with ems 435.  186/96 106HR 16RR 91% RA 98T oral
